# Patient Record
Sex: Female | Born: 1955 | Hispanic: No | Marital: Single | Smoking: Current every day smoker
Health system: Southern US, Community
[De-identification: ages and names within clinical notes are randomized; demographics above are authoritative.]

## PROBLEM LIST (undated history)

## (undated) DIAGNOSIS — J449 Chronic obstructive pulmonary disease, unspecified: Secondary | ICD-10-CM

## (undated) DIAGNOSIS — N281 Cyst of kidney, acquired: Secondary | ICD-10-CM

## (undated) DIAGNOSIS — F419 Anxiety disorder, unspecified: Secondary | ICD-10-CM

## (undated) DIAGNOSIS — S2241XA Multiple fractures of ribs, right side, initial encounter for closed fracture: Secondary | ICD-10-CM

## (undated) DIAGNOSIS — E162 Hypoglycemia, unspecified: Secondary | ICD-10-CM

## (undated) DIAGNOSIS — M1990 Unspecified osteoarthritis, unspecified site: Secondary | ICD-10-CM

## (undated) DIAGNOSIS — G4700 Insomnia, unspecified: Secondary | ICD-10-CM

## (undated) HISTORY — PX: ABDOMINAL HYSTERECTOMY: SHX81

## (undated) HISTORY — PX: KIDNEY SURGERY: SHX687

---

## 1980-04-03 HISTORY — PX: RIB FRACTURE SURGERY: SHX2358

## 1980-08-01 DIAGNOSIS — S2241XA Multiple fractures of ribs, right side, initial encounter for closed fracture: Secondary | ICD-10-CM

## 1980-08-01 HISTORY — DX: Multiple fractures of ribs, right side, initial encounter for closed fracture: S22.41XA

## 1998-11-26 ENCOUNTER — Encounter: Admission: RE | Admit: 1998-11-26 | Discharge: 1998-11-26 | Payer: Self-pay | Admitting: Internal Medicine

## 1999-06-02 ENCOUNTER — Encounter: Admission: RE | Admit: 1999-06-02 | Discharge: 1999-06-02 | Payer: Self-pay | Admitting: Hematology and Oncology

## 2000-01-04 ENCOUNTER — Emergency Department (HOSPITAL_COMMUNITY): Admission: EM | Admit: 2000-01-04 | Discharge: 2000-01-05 | Payer: Self-pay | Admitting: Emergency Medicine

## 2000-01-24 ENCOUNTER — Encounter: Payer: Self-pay | Admitting: Emergency Medicine

## 2000-01-24 ENCOUNTER — Emergency Department (HOSPITAL_COMMUNITY): Admission: EM | Admit: 2000-01-24 | Discharge: 2000-01-24 | Payer: Self-pay

## 2002-01-24 ENCOUNTER — Other Ambulatory Visit: Admission: RE | Admit: 2002-01-24 | Discharge: 2002-01-24 | Payer: Self-pay | Admitting: Internal Medicine

## 2002-12-29 ENCOUNTER — Other Ambulatory Visit: Admission: RE | Admit: 2002-12-29 | Discharge: 2002-12-29 | Payer: Self-pay | Admitting: *Deleted

## 2003-01-09 ENCOUNTER — Encounter (INDEPENDENT_AMBULATORY_CARE_PROVIDER_SITE_OTHER): Payer: Self-pay

## 2003-01-09 ENCOUNTER — Ambulatory Visit (HOSPITAL_COMMUNITY): Admission: RE | Admit: 2003-01-09 | Discharge: 2003-01-09 | Payer: Self-pay | Admitting: *Deleted

## 2003-08-18 ENCOUNTER — Observation Stay (HOSPITAL_COMMUNITY): Admission: RE | Admit: 2003-08-18 | Discharge: 2003-08-19 | Payer: Self-pay | Admitting: *Deleted

## 2003-08-18 ENCOUNTER — Encounter (INDEPENDENT_AMBULATORY_CARE_PROVIDER_SITE_OTHER): Payer: Self-pay | Admitting: Specialist

## 2006-08-19 ENCOUNTER — Inpatient Hospital Stay (HOSPITAL_COMMUNITY): Admission: EM | Admit: 2006-08-19 | Discharge: 2006-08-23 | Payer: Self-pay | Admitting: Emergency Medicine

## 2006-08-23 ENCOUNTER — Ambulatory Visit: Payer: Self-pay | Admitting: Hematology & Oncology

## 2006-08-29 ENCOUNTER — Ambulatory Visit: Payer: Self-pay | Admitting: *Deleted

## 2006-10-03 ENCOUNTER — Ambulatory Visit: Payer: Self-pay | Admitting: Internal Medicine

## 2007-01-24 ENCOUNTER — Observation Stay (HOSPITAL_COMMUNITY): Admission: EM | Admit: 2007-01-24 | Discharge: 2007-01-25 | Payer: Self-pay | Admitting: Emergency Medicine

## 2007-02-13 ENCOUNTER — Emergency Department (HOSPITAL_COMMUNITY): Admission: EM | Admit: 2007-02-13 | Discharge: 2007-02-13 | Payer: Self-pay | Admitting: *Deleted

## 2007-03-09 ENCOUNTER — Ambulatory Visit (HOSPITAL_COMMUNITY): Admission: RE | Admit: 2007-03-09 | Discharge: 2007-03-09 | Payer: Self-pay | Admitting: Orthopedic Surgery

## 2008-04-15 ENCOUNTER — Ambulatory Visit: Payer: Self-pay | Admitting: Internal Medicine

## 2008-04-16 ENCOUNTER — Ambulatory Visit: Payer: Self-pay | Admitting: Internal Medicine

## 2008-07-03 ENCOUNTER — Emergency Department (HOSPITAL_COMMUNITY): Admission: EM | Admit: 2008-07-03 | Discharge: 2008-07-03 | Payer: Self-pay | Admitting: Emergency Medicine

## 2008-07-06 ENCOUNTER — Ambulatory Visit: Payer: Self-pay | Admitting: Internal Medicine

## 2008-07-06 ENCOUNTER — Encounter: Admission: RE | Admit: 2008-07-06 | Discharge: 2008-07-06 | Payer: Self-pay | Admitting: Internal Medicine

## 2008-11-02 ENCOUNTER — Ambulatory Visit: Payer: Self-pay | Admitting: Internal Medicine

## 2009-03-08 ENCOUNTER — Emergency Department (HOSPITAL_COMMUNITY): Admission: EM | Admit: 2009-03-08 | Discharge: 2009-03-08 | Payer: Self-pay | Admitting: Emergency Medicine
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
## 2010-08-16 NOTE — Discharge Summary (Signed)
NAMEMARCEL, [name]             ACCOUNT NO.:  [id number]   MEDICAL RECORD NO.:  [id number]          PATIENT TYPE:  INP   LOCATION:  1527                         FACILITY:  [facility]   PHYSICIAN:  [name], M.D.   DATE OF BIRTH:  Oct 08, 1955   DATE OF ADMISSION:  08/19/2006  DATE OF DISCHARGE:  08/23/2006                               DISCHARGE SUMMARY   PRIMARY CARE PHYSICIAN:  [name], M.D.   HEMATOLOGIST:  [name], but spoke with Dr. Donnie Campbell regarding the  patient's neutropenia.   DISCHARGE DIAGNOSES:  1. Cellulitis.  2. Absolute neutropenia  3. Ongoing tobacco abuse.  4. Remote history of polysubstance abuse.  5. Bipolar disorder.  6. Anxiety disorder.   DISCHARGE MEDICATIONS:  1. Celexa 10 mg daily.  2. Avelox 400 mg daily x1 week.  3. Trazodone 100 mg at bedtime p.r.n.   CONSULTATIONS:  None.  However, spoke with Dr. Donnie Campbell by telephone  regarding the patient's neutropenia.   PROCEDURES AND DIAGNOSTIC STUDIES:  1. Chest x-ray on Aug 19, 2006 showed deformity and evidence of prior      surgery in the right chest wall.  The patient had a flame-shaped      opacity in the right suprahilar region likely related to scarring,      but neoplasm could not be completely excluded.  2. Right ankle film on Aug 19, 2006 showed no acute bony      abnormalities.   DISCHARGE LABORATORY VALUES:  Sodium was 138, potassium 3.9, chloride  107, bicarbonate 26, BUN 9, creatinine 0.93, glucose 83.  White blood  cell count was 3.7, hemoglobin 12.9, hematocrit 37.7, platelet count  283, with an absolute neutrophil count of 0.3.   BRIEF ADMISSION HISTORY OF PRESENT ILLNESS:  The patient is a 55-year-  old female who presented with a chief complaint of right foot swelling  of one day's duration.  The patient claims to have had a recent lice  infestation approximately one month prior to this and used Lice B Gone  for eradication.  She has had persistent pruritus all over her body  and  excoriations from self-inflicted scratching which have become  superinfected.  She was admitted for IV antibiotics for cellulitis.   HOSPITAL COURSE:  1. Cellulitis:  The patient's cellulitis was localized to her right      foot.  She had various areas of excoriations throughout her body      from self-induced scratching.  The patient seemed to have a      persistent preoccupation with thinking that she had ongoing lice      infestation.  A thorough examination of her hair follicles failed      to reveal any evidence of lice or other parasites.  She was      initially empirically treated with Unasyn and clindamycin.  Her      cellulitis rapidly resolved on this therapy.  She has been      transitioned over to p.o. Avelox will complete an additional week      of therapy, given her absolute neutropenia.  2. Absolute neutropenia:  The patient's initial white blood cell count      with differential showed a leukocytosis and absolute neutropenia.      The patient states that she has been told that she has had a low      blood count all of her life, but cannot specify any details beyond      this.  The patient remained neutropenic throughout the course of      her hospitalization.  Her thyroid function was checked and was      found to be normal.  Hepatitis B and C serologies were negative.      Direct and indirect antiglobulin studies were negative.  There was      no evidence of B12 or folate deficiency on laboratory testing.  ANA      studies are pending at the time of this dictation, but there is no      evidence of connective tissue disease or synovitis clinically.  The      patient's neutropenia was prior to any initiation of antibiotic      therapy.  She was not on any medications to suggest a possible      etiology of adverse drug reaction.  Given these findings, the case      was discussed with Dr. Donnie Campbell of hematology.  He does recommend      further outpatient diagnostic workup  with bone marrow biopsy.  Dr.      Renelda Campbell office will call the patient for a follow-up appointment      for further evaluation.  The patient was given instructions on      neutropenic precautions prior to discharge.  3. Tobacco abuse:  The patient was counseled on the importance of      tobacco cessation.  4. History of polysubstance abuse:  No evidence of any recent use.      The patient did not have any problems with withdrawal or other      symptoms suggestive of ongoing abuse.  5. Bipolar disorder/anxiety disorder:  The patient did have an      appreciable anxiety with regard to a preoccupation with her hygiene      and fear of ongoing lice infestation.  She claimed to have been      able to see the lice running through her hair and to feel them.      Because this is not compatible with lice infestation, a psychiatric      consultation was requested and kindly provided by Dr. Jeanie Campbell,      who recommended the addition of Celexa and Seroquel to her      medication regimen.  She had a possible adverse reaction to the      Seroquel so this was discontinued.  He suggests using trazodone      p.r.n. for sleep.  These changes were instituted.  The patient had      significant relief of her pruritus and self-induced excoriations      while in the hospital.  She will follow up with mental health post      discharge.   DISPOSITION:  The patient is medically stable for discharge.  We are  awaiting [name] final recommendations with regard to discharge  home versus an elective voluntary psychiatric hospitalization.  She  should follow up with her primary care physician early next week and  with Dr. Donnie Campbell or one of the other hematologists when called with an  appointment time.  [name], M.D.  Electronically Signed     CR/MEDQ  D:  08/23/2006  T:  08/23/2006  Job:  [id number]   cc:   [name], M.D.  Fax: [phone]  [name], M.D.  Fax: [phone]
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
## 2010-08-16 NOTE — Consult Note (Signed)
NAMELYSSA, HACKLEY             ACCOUNT NO.:  [id number]   MEDICAL RECORD NO.:  [id number]          PATIENT TYPE:  INP   LOCATION:  1527                         FACILITY:  [facility]   PHYSICIAN:  Antonietta Breach, M.D.  DATE OF BIRTH:  08-May-1955   DATE OF CONSULTATION:  08/21/2006  DATE OF DISCHARGE:                                 CONSULTATION   REQUESTING PHYSICIAN:  Hillery Aldo, M.D.   REASON FOR CONSULTATION:  Psychosis and anxiety.   HISTORY OF PRESENT ILLNESS:  Ms. Belanger is a 55 year old female  admitted to the [facility] on Aug 19, 2006.   The patient was suffering from a right foot swelling.  She had been  experiencing the sensation that she had lice in her head after she had  already been rid of lice for a month.  This was a very frustrating  experience for her and it stimulated her to scratch her scalp as well as  her leg and back.  She does have intact interests and positive goals.  She has no thoughts of harming herself or others.  She has no delusions.   PAST PSYCHIATRIC HISTORY:  The patient does have a history of abusing  alcohol.  She does not use any illegal drugs.  She has never had any  hallucinations before.   FAMILY PSYCHIATRIC HISTORY:  None known.   SOCIAL HISTORY:  Religion is [employer].  The patient is single.  She  resides alone.  She smokes a pack of cigarettes a day.   The patient does have a history of IV drug abuse including heroin.  She  has used cocaine and marijuana.  She has been free of use for 4 years.  She does have a history of chemical dependency rehabilitation.   PAST MEDICAL HISTORY:  1. Right foot cellulitis.  2. Lice infestation.  3. Leukopenia.   MEDICATIONS:  The MAR is reviewed and the patient is on Trazodone 50 mg  nightly and Valium 5 mg b.i.d.   ALLERGIES:  SULFA.   LABORATORY DATA:  WBC 3.1, hemoglobin 12.3, platelet count 249, BUN 8,  creatinine 0.87, calcium 10.7.   REVIEW OF SYSTEMS:   Noncontributory.   PHYSICAL EXAMINATION:  VITAL SIGNS:  Temperature 98.2, pulse 82,  respiration 20, blood pressure 110/74, O2 saturation on room air 100%.   MENTAL STATUS EXAM:  Ms. Broadfoot is an alert female sitting up in her  hospital bed with good eye contact.  She is oriented to all spheres.  Her memory is intact to immediate recent and remote.  Her speech is  within normal limits.  Thought process logical, coherent, goal-directed.  No looseness of associations.  Thought content with no thoughts of  harming herself, no thoughts of harming others, no delusions.  Hallucinations as described above.  Insight is good.  She realizes that  these sensations are nonrealistic.  Her judgment is within normal  limits.  She is socially appropriate.   ASSESSMENT:  AXIS I:  1.  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  1. (293.84) Anxiety disorder, not otherwise specified.  AXIS II:  Deferred.  AXIS III:  See general medical problems.  AXIS IV:  Primary support group, general medical.  AXIS V:  55.   Ms. Sliva is not at risk to harm herself or others.  She agrees to  use psychiatric emergency services as needed.   The indications, alternatives and adverse effects of the following were  discussed with the patient:  Celexa for antianxiety, antidepression,  Seroquel for antipsychosis including the risk of nonreversible movement  hyperglycemia and trazodone for insomnia.   The patient understands and would like to proceed as follows.   RECOMMENDATIONS:  1. Celexa 10 mg q.a.m.  2. Discontinue Trazodone and continue the Trazodone p.r.n.  3. Start Seroquel 50 mg nightly.      Antonietta Breach, M.D.  Electronically Signed     JW/MEDQ  D:  09/09/2006  T:  09/10/2006  Job:  [id number]
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
## 2010-08-19 NOTE — Op Note (Signed)
NAME:  [name], [name]                       ACCOUNT NO.:  [id number]   MEDICAL RECORD NO.:  [id number]                   PATIENT TYPE:  OBV   LOCATION:  9399                                 [facility]ACILITY[facility]   PHYSICIAN:  [place] B. Earlene Plater, M.D.               DATE OF BIRTH:  [birth year]   DATE OF PROCEDURE:  08/18/2003  DATE OF DISCHARGE:                                 OPERATIVE REPORT   PREOPERATIVE DIAGNOSES:  1. Menorrhagia.  2. Dysmenorrhea.   POSTOPERATIVE DIAGNOSES:  1. Menorrhagia.  2. Dysmenorrhea.   PROCEDURE:  Laparoscopically-assisted vaginal hysterectomy.   SURGEON:  [place] Holstein. Earlene Plater, M.D.   ASSISTANT:  Lenoard Aden, M.D.   ANESTHESIA:  General.   FINDINGS:  Normal-sized uterus with a pedunculated fundal fibroid.  Normal-  appearing tubes and ovaries.  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  Liver scarring consistent with previous alcohol abuse.   ESTIMATED BLOOD LOSS:  150.   SPECIMENS:  Uterus, cervix.   DRAINS:  Foley.   COMPLICATIONS:  None.   INDICATIONS:  Patient with above complaints, status post previous  hysteroscopic removal of endometrial polyp, with persistent complaints of  menorrhagia and dysmenorrhea.  She was offered and recommended to have  either an endometrial ablation or a levonorgestrel IUD.  The patient  declined both, stating she preferred to proceed with definitive surgical  therapy.   PROCEDURE:  Patient taken to the operating room and general anesthesia  obtained.  She was prepped and draped in the standard fashion and a Foley  catheter inserted into the bladder.  A Hulka tenaculum attached to the  anterior lip of the cervix.   Attention turned to the abdomen.  A vertical incision made in the umbilicus,  carried sharply to the fascia.  The fascia was divided sharply and elevated  with the Kocher clamps.  Posterior sheath and  peritoneum were elevated with  Allis clamp and entered sharply with a knife, a pursestring suture of 0  Vicryl placed around the fascial defect, Hasson cannula inserted and  secured.  Pneumoperitoneum obtained with CO2 gas.   Trendelenburg position obtained and 5 mm port was placed in each lower  quadrant under direct laparoscopic visualization.  The bowel was mobilized  superiorly with a blunt probe and pelvis and abdomen inspected with the  above findings noted.   The course of the left ureter was identified and found to be well away from  the area of interest.  There was an implant of endometriosis over the left  ureter midway through its course in the pelvis.  This was left untreated  given its proximity to the ureter.   The left round ligament was placed on traction and cauterized with bipolar  and divided.  The left tube and uterine-ovarian ligament were similarly  cauterized and divided.  The upper portion of the cardinal ligaments was  then cauterized with bipolar and divided sharply.  The bladder flap was  created sharply and the area of previously-noted endometriosis was excised  along with the bladder flap dissection.   The procedure was repeated on the right side in a similar fashion after  identification of the course of the ureter.  The remainder of the bladder  flap was taken down sharply.  Pneumoperitoneum was released and attention  returned to the vagina.   The patient is nulliparous, and there was not adequate room to insert a  weighted speculum without making an episiotomy.  Therefore, a second degree  episiotomy was made after local infiltration with Nesacaine.  This was done  with the Mayo scissors in a vertical fashion.  This allowed adequate room to  insert the speculum and enter the posterior cul-de-sac sharply with Mayo  scissors.  The long weighted speculum was inserted into the posterior cul-de-  sac and the uterosacral ligaments clamped on each side with  a Heaney clamp,  divided, and suture ligated.  The remainder of the vagina was circumscribed  with the Bovie and the anterior cul-de-sac entered sharply.  A Deaver  retractor placed in the anterior cul-de-sac.   The remainder of the cardinal ligaments were then clamped on each side with  the LigaSure device, cauterized, and divided sharply.  The uterus and cervix  were delivered as an intact specimen without difficulty.   The vaginal cuff was inspected and was otherwise hemostatic.   A culdoplasty suture was placed by entering the vagina posteriorly on the  left, taken by the left uterosacral ligament, reefed along the posterior  peritoneum, taken by the right uterosacral ligament, and exiting the vagina  posteriorly on the right.  This was tagged for a later time.   The remainder of the vaginal cuff was then closed with interrupted figure-of-  eight sutures of 0 Vicryl.  Hemostasis obtained.  The previously-placed  culdoplasty suture was then snugged down and tied.  This provided good  support for the vaginal cuff.   The episiotomy was then closed with 2-0 Vicryl in the muscle and then 3-0  Vicryl in a subcuticular fashion on the skin.   Attention was then returned to the abdomen.  The gown and gloves were  changed and pneumoperitoneum re-obtained.  The pelvis was inspected,  irrigated, and found to be hemostatic.  The 5 mm ports were then removed and  the sites inspected laparoscopically and found to be hemostatic.   The scope was removed, gas released, and the Hasson cannula removed.  The  abdominal wall was elevated with Army-[employer] retractors and the fascial  incision closed by snugging down the previously-placed pursestring suture.  This obliterated the fascial defect, and no intra-abdominal contents  herniated through prior to closure.  The skin at each site was closed with subcuticular 4-0 Vicryl.  On the left lower quadrant port, this did not  afford good reapproximation of  the incision edges; therefore, a simple  stitch of the same suture was placed.   The patient tolerated the procedure well.  There were no complications.  She  was taken to the recovery room awake and in stable condition.  All counts  were correct per the operating room staff.  Gerri Spore B. Earlene Plater, M.D.    WBD/MEDQ  D:  08/18/2003  T:  08/19/2003  Job:  [id number]
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
## 2010-08-19 NOTE — Op Note (Signed)
   NAME:  JAREN, KEARN                       ACCOUNT NO.:  [id number]   MEDICAL RECORD NO.:  [id number]                   PATIENT TYPE:  AMB   LOCATION:  SDC                                  [facility]   PHYSICIAN:  [place] B. Earlene Plater, M.D.               DATE OF BIRTH:  [birth year]   DATE OF PROCEDURE:  01/09/2003  DATE OF DISCHARGE:                                 OPERATIVE REPORT   PREOPERATIVE DIAGNOSIS:  Abnormal bleeding.   POSTOPERATIVE DIAGNOSIS:  Abnormal bleeding.   PROCEDURES:  1. Hysteroscopy dilatation and curettage.  2. Polyp removal.   SURGEON:  [place] B. Earlene Plater, M.D.   ANESTHESIA:  General.   FINDINGS:  Two endometrial polyps and shaggy endometrium.   SPECIMENS:  Endometrial polyps and endometrial curettings.   INDICATIONS FOR PROCEDURE:  The patient with a history of abnormal bleeding,  thickened endometrium on ultrasound in the office was not tolerating vaginal  probe ultrasound well and I did not think she would tolerate a saline  infusion ultrasound prior to hysteroscopy.   PROCEDURE:  The patient was taken to the operating room and general  anesthesia obtained.  She was placed in the [place] stirrups and prepped and  draped in the standard fashion.  The bladder emptied with a red rubber  catheter.  Examination under anesthesia showed a normal sized, anteverted  uterus.  No adnexal masses palpable.   Speculum inserted and a single-tooth tenaculum attached to the anterior lip  of the cervix.  The cervix dilated to 21 without difficulty.  The diagnostic  hysteroscope was inserted after being flushed with sorbitol.  With good  uterine distention two endometrial polyps were visualized.  These were  removed with the Randall stone forceps and the endometrium gently curetted.  The scope was reinserted and no other abnormalities noted.  Therefore, the  procedure was terminated.   The patient tolerated the procedure well.  There were no complications.  She  was taken to the recovery room awake, alert, and in stable condition.  Fluid  deficit was 20 mL of sorbitol.                                               Gerri Spore B. Earlene Plater, M.D.    WBD/MEDQ  D:  01/09/2003  T:  01/09/2003  Job:  [id number]

## 2010-08-31 ENCOUNTER — Emergency Department (HOSPITAL_COMMUNITY): Payer: Self-pay

## 2010-08-31 ENCOUNTER — Other Ambulatory Visit (HOSPITAL_COMMUNITY): Payer: Self-pay | Admitting: Family Medicine

## 2010-08-31 ENCOUNTER — Emergency Department (HOSPITAL_COMMUNITY)
Admission: EM | Admit: 2010-08-31 | Discharge: 2010-08-31 | Disposition: A | Discharge status: Home / Self Care | Payer: Self-pay | Attending: Emergency Medicine | Admitting: Emergency Medicine

## 2010-08-31 DIAGNOSIS — J4489 Other specified chronic obstructive pulmonary disease: Secondary | ICD-10-CM | POA: Insufficient documentation

## 2010-08-31 DIAGNOSIS — J449 Chronic obstructive pulmonary disease, unspecified: Secondary | ICD-10-CM | POA: Insufficient documentation

## 2010-08-31 DIAGNOSIS — M7981 Nontraumatic hematoma of soft tissue: Secondary | ICD-10-CM | POA: Insufficient documentation

## 2010-08-31 DIAGNOSIS — R0789 Other chest pain: Secondary | ICD-10-CM | POA: Insufficient documentation

## 2010-08-31 LAB — COMPREHENSIVE METABOLIC PANEL
ALT: 18 U/L (ref 0–35)
AST: 20 U/L (ref 0–37)
Albumin: 3.9 g/dL (ref 3.5–5.2)
Alkaline Phosphatase: 84 U/L (ref 39–117)
BUN: 16 mg/dL (ref 6–23)
CO2: 24 mEq/L (ref 19–32)
Calcium: 10.2 mg/dL (ref 8.4–10.5)
Chloride: 104 mEq/L (ref 96–112)
Creatinine, Ser: 0.73 mg/dL (ref 0.4–1.2)
GFR calc Af Amer: >60 mL/min (ref >60)
GFR calc non Af Amer: >60 mL/min (ref >60)
Glucose, Bld: 92 mg/dL (ref 70–99)
Potassium: 4.7 mEq/L (ref 3.5–5.1)
Sodium: 136 mEq/L (ref 135–145)
Total Bilirubin: 0.3 mg/dL (ref 0.3–1.2)
Total Protein: 6.3 g/dL (ref 6.0–8.3)

## 2010-08-31 LAB — DIFFERENTIAL
Basophils Absolute: 0.1 10*3/uL (ref 0.0–0.1)
Basophils Relative: 1 % (ref 0–1)
Eosinophils Absolute: 0.1 10*3/uL (ref 0.0–0.7)
Eosinophils Relative: 1 % (ref 0–5)
Lymphocytes Relative: 17 % (ref 12–46)
Lymphs Abs: 1.7 10*3/uL (ref 0.7–4.0)
Monocytes Absolute: 0.9 10*3/uL (ref 0.1–1.0)
Monocytes Relative: 8 % (ref 3–12)
Neutro Abs: 7.8 10*3/uL — ABNORMAL HIGH (ref 1.7–7.7)
Neutrophils Relative %: 74 % (ref 43–77)

## 2010-08-31 LAB — PROTIME-INR
INR: 1.03 (ref 0.00–1.49)
Prothrombin Time: 13.7 seconds (ref 11.6–15.2)

## 2010-08-31 LAB — CBC
HCT: 33.5 % — ABNORMAL LOW (ref 36.0–46.0)
Hemoglobin: 11.7 g/dL — ABNORMAL LOW (ref 12.0–15.0)
MCH: 31.9 pg (ref 26.0–34.0)
MCHC: 34.9 g/dL (ref 30.0–36.0)
MCV: 91.3 fL (ref 78.0–100.0)
Platelets: 239 10*3/uL (ref 150–400)
RBC: 3.67 MIL/uL — ABNORMAL LOW (ref 3.87–5.11)
RDW: 12.7 % (ref 11.5–15.5)
WBC: 10.5 10*3/uL (ref 4.0–10.5)

## 2010-08-31 LAB — LIPASE, BLOOD: Lipase: 33 U/L (ref 11–59)

## 2010-08-31 LAB — APTT: aPTT: 27 seconds (ref 24–37)

## 2010-08-31 MED ORDER — IOHEXOL 300 MG/ML  SOLN
75.0000 mL | Freq: Once | INTRAMUSCULAR | Status: AC | PRN
  Administered 2010-08-31: 75 mL via INTRAVENOUS

## 2011-01-11 LAB — URINALYSIS, ROUTINE W REFLEX MICROSCOPIC
Bilirubin Urine: NEGATIVE
Glucose, UA: NEGATIVE
Hgb urine dipstick: NEGATIVE
Ketones, ur: NEGATIVE
Nitrite: NEGATIVE
Protein, ur: NEGATIVE
Specific Gravity, Urine: >1.046 — ABNORMAL HIGH
Urobilinogen, UA: 0.2
pH: 5.5

## 2011-01-11 LAB — CBC
HCT: 31.2 — ABNORMAL LOW
HCT: 34.3 — ABNORMAL LOW
Hemoglobin: 10.9 — ABNORMAL LOW
Hemoglobin: 11.9 — ABNORMAL LOW
MCHC: 34.8
MCHC: 35.1
MCV: 93.3
MCV: 94.3
Platelets: 226
Platelets: 272
RBC: 3.34 — ABNORMAL LOW
RBC: 3.64 — ABNORMAL LOW
RDW: 13
RDW: 13.5
WBC: 10.5
WBC: 7.8

## 2011-01-11 LAB — BASIC METABOLIC PANEL
BUN: 6
BUN: 8
CO2: 21
CO2: 27
Calcium: 9
Calcium: 9.9
Chloride: 109
Chloride: 110
Creatinine, Ser: 0.76
Creatinine, Ser: 0.82
GFR calc Af Amer: >60
GFR calc Af Amer: >60
GFR calc non Af Amer: >60
GFR calc non Af Amer: >60
Glucose, Bld: 102 — ABNORMAL HIGH
Glucose, Bld: 83
Potassium: 3.5
Potassium: 4
Sodium: 137
Sodium: 142

## 2011-01-11 LAB — I-STAT 8, (EC8 V) (CONVERTED LAB)
Acid-base deficit: 2
BUN: 9
Bicarbonate: 21.1
Chloride: 111
Glucose, Bld: 104 — ABNORMAL HIGH
HCT: 36
Hemoglobin: 12.2
Operator id: 198171
Potassium: 3.7
Sodium: 142
TCO2: 22
pCO2, Ven: 31.4 — ABNORMAL LOW
pH, Ven: 7.434 — ABNORMAL HIGH

## 2011-01-11 LAB — POCT I-STAT CREATININE
Creatinine, Ser: 0.8
Operator id: 198171

## 2011-04-24 ENCOUNTER — Emergency Department (HOSPITAL_BASED_OUTPATIENT_CLINIC_OR_DEPARTMENT_OTHER)
Admission: EM | Admit: 2011-04-24 | Discharge: 2011-04-24 | Disposition: A | Discharge status: Home / Self Care | Payer: Self-pay | Attending: Emergency Medicine | Admitting: Emergency Medicine

## 2011-04-24 ENCOUNTER — Encounter (HOSPITAL_BASED_OUTPATIENT_CLINIC_OR_DEPARTMENT_OTHER): Payer: Self-pay | Admitting: *Deleted

## 2011-04-24 ENCOUNTER — Emergency Department (INDEPENDENT_AMBULATORY_CARE_PROVIDER_SITE_OTHER): Payer: Self-pay

## 2011-04-24 DIAGNOSIS — S42009A Fracture of unspecified part of unspecified clavicle, initial encounter for closed fracture: Secondary | ICD-10-CM | POA: Insufficient documentation

## 2011-04-24 DIAGNOSIS — J449 Chronic obstructive pulmonary disease, unspecified: Secondary | ICD-10-CM | POA: Insufficient documentation

## 2011-04-24 DIAGNOSIS — J4489 Other specified chronic obstructive pulmonary disease: Secondary | ICD-10-CM | POA: Insufficient documentation

## 2011-04-24 DIAGNOSIS — IMO0002 Reserved for concepts with insufficient information to code with codable children: Secondary | ICD-10-CM | POA: Insufficient documentation

## 2011-04-24 DIAGNOSIS — Z79899 Other long term (current) drug therapy: Secondary | ICD-10-CM | POA: Insufficient documentation

## 2011-04-24 DIAGNOSIS — F17200 Nicotine dependence, unspecified, uncomplicated: Secondary | ICD-10-CM | POA: Insufficient documentation

## 2011-04-24 DIAGNOSIS — S42002A Fracture of unspecified part of left clavicle, initial encounter for closed fracture: Secondary | ICD-10-CM

## 2011-04-24 DIAGNOSIS — W19XXXA Unspecified fall, initial encounter: Secondary | ICD-10-CM

## 2011-04-24 DIAGNOSIS — Z8739 Personal history of other diseases of the musculoskeletal system and connective tissue: Secondary | ICD-10-CM | POA: Insufficient documentation

## 2011-04-24 DIAGNOSIS — S42023A Displaced fracture of shaft of unspecified clavicle, initial encounter for closed fracture: Secondary | ICD-10-CM | POA: Insufficient documentation

## 2011-04-24 HISTORY — DX: Cyst of kidney, acquired: N28.1

## 2011-04-24 HISTORY — DX: Multiple fractures of ribs, right side, initial encounter for closed fracture: S22.41XA

## 2011-04-24 HISTORY — DX: Hypoglycemia, unspecified: E16.2

## 2011-04-24 HISTORY — DX: Chronic obstructive pulmonary disease, unspecified: J44.9

## 2011-04-24 HISTORY — DX: Insomnia, unspecified: G47.00

## 2011-04-24 HISTORY — DX: Unspecified osteoarthritis, unspecified site: M19.90

## 2011-04-24 HISTORY — DX: Anxiety disorder, unspecified: F41.9

## 2011-04-24 MED ORDER — OXYCODONE-ACETAMINOPHEN 5-325 MG PO TABS: 2.0000 | ORAL_TABLET | Freq: Four times a day (QID) | ORAL | Status: AC | PRN

## 2011-04-24 MED ORDER — HYDROMORPHONE HCL PF 2 MG/ML IJ SOLN
2.0000 mg | Freq: Once | INTRAMUSCULAR | Status: AC
  Administered 2011-04-24: 2 mg via INTRAMUSCULAR
  Filled 2011-04-24: qty 1
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
## 2015-06-16 ENCOUNTER — Emergency Department (HOSPITAL_COMMUNITY)
Admission: EM | Admit: 2015-06-16 | Discharge: 2015-06-16 | Disposition: A | Discharge status: Home / Self Care | Payer: BLUE CROSS/BLUE SHIELD | Attending: Emergency Medicine | Admitting: Emergency Medicine

## 2015-06-16 ENCOUNTER — Emergency Department (HOSPITAL_COMMUNITY): Payer: BLUE CROSS/BLUE SHIELD

## 2015-06-16 DIAGNOSIS — Z79899 Other long term (current) drug therapy: Secondary | ICD-10-CM | POA: Insufficient documentation

## 2015-06-16 DIAGNOSIS — Y9289 Other specified places as the place of occurrence of the external cause: Secondary | ICD-10-CM | POA: Diagnosis not present

## 2015-06-16 DIAGNOSIS — S62318A Displaced fracture of base of other metacarpal bone, initial encounter for closed fracture: Secondary | ICD-10-CM

## 2015-06-16 DIAGNOSIS — Y998 Other external cause status: Secondary | ICD-10-CM | POA: Insufficient documentation

## 2015-06-16 DIAGNOSIS — Y9389 Activity, other specified: Secondary | ICD-10-CM | POA: Diagnosis not present

## 2015-06-16 DIAGNOSIS — Z8639 Personal history of other endocrine, nutritional and metabolic disease: Secondary | ICD-10-CM | POA: Diagnosis not present

## 2015-06-16 DIAGNOSIS — S62397A Other fracture of fifth metacarpal bone, left hand, initial encounter for closed fracture: Secondary | ICD-10-CM | POA: Insufficient documentation

## 2015-06-16 DIAGNOSIS — F17210 Nicotine dependence, cigarettes, uncomplicated: Secondary | ICD-10-CM | POA: Insufficient documentation

## 2015-06-16 DIAGNOSIS — G4700 Insomnia, unspecified: Secondary | ICD-10-CM | POA: Diagnosis not present

## 2015-06-16 DIAGNOSIS — W2209XA Striking against other stationary object, initial encounter: Secondary | ICD-10-CM | POA: Insufficient documentation

## 2015-06-16 DIAGNOSIS — M1990 Unspecified osteoarthritis, unspecified site: Secondary | ICD-10-CM | POA: Diagnosis not present

## 2015-06-16 DIAGNOSIS — Z7982 Long term (current) use of aspirin: Secondary | ICD-10-CM | POA: Insufficient documentation

## 2015-06-16 DIAGNOSIS — J449 Chronic obstructive pulmonary disease, unspecified: Secondary | ICD-10-CM | POA: Insufficient documentation

## 2015-06-16 DIAGNOSIS — Z7951 Long term (current) use of inhaled steroids: Secondary | ICD-10-CM | POA: Diagnosis not present

## 2015-06-16 DIAGNOSIS — S6991XA Unspecified injury of right wrist, hand and finger(s), initial encounter: Secondary | ICD-10-CM | POA: Insufficient documentation

## 2015-06-16 DIAGNOSIS — S6992XA Unspecified injury of left wrist, hand and finger(s), initial encounter: Secondary | ICD-10-CM | POA: Diagnosis present

## 2015-06-16 DIAGNOSIS — F419 Anxiety disorder, unspecified: Secondary | ICD-10-CM | POA: Insufficient documentation

## 2015-06-16 DIAGNOSIS — Q6100 Congenital renal cyst, unspecified: Secondary | ICD-10-CM | POA: Diagnosis not present

## 2015-06-16 MED ORDER — OXYCODONE-ACETAMINOPHEN 5-325 MG PO TABS
1.0000 | ORAL_TABLET | Freq: Once | ORAL | Status: AC
  Administered 2015-06-16: 1 via ORAL
  Filled 2015-06-16: qty 1

## 2015-06-16 MED ORDER — OXYCODONE-ACETAMINOPHEN 5-325 MG PO TABS: 2.0000 | ORAL_TABLET | ORAL | Status: DC | PRN

## 2015-06-16 NOTE — ED Notes (Signed)
Pt transported to xray 
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
## 2015-06-16 NOTE — ED Notes (Signed)
Ortho tech paged  
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
## 2015-06-16 NOTE — ED Notes (Signed)
Pt returned from xray

## 2015-06-16 NOTE — Discharge Instructions (Signed)
Metacarpal Fracture °A metacarpal fracture is a break (fracture) of a bone in the hand. Metacarpals are the bones that extend from your knuckles to your wrist. In each hand, you have five metacarpal bones that connect your fingers and your thumb to your wrist. °Some hand fractures have bone pieces that are close together and stable (simple). These fractures may be treated with only a splint or cast. Hand fractures that have many pieces of broken bone (comminuted), unstable bone pieces (displaced), or a bone that breaks through the skin (compound) usually require surgery. °CAUSES °This injury may be caused by: °· A fall. °· A hard, direct hit to your hand. °· An injury that squeezes your knuckle, stretches your finger out of place, or crushes your hand. °RISK FACTORS °This injury is more likely to occur if: °· You play contact sports. °· You have certain bone diseases. °SYMPTOMS  °Symptoms of this type of fracture develop soon after the injury. Symptoms may include: °· Swelling. °· Pain. °· Stiffness. °· Increased pain with movement. °· Bruising. °· Inability to move a finger. °· A shortened finger. °· A finger knuckle that looks sunken in. °· Unusual appearance of the hand or finger (deformity). °DIAGNOSIS  °This injury may be diagnosed based on your signs and symptoms, especially if you had a recent hand injury. Your health care provider will perform a physical exam. He or she may also order X-rays to confirm the diagnosis.  °TREATMENT  °Treatment for this injury depends on the type of fracture you have and how severe it is. Possible treatments include: °· Non-reduction. This can be done if the bone does not need to be moved back into place. The fracture can be casted or splinted as it is.   °· Closed reduction. If your bone is stable and can be moved back into place, you may only need to wear a cast or splint or have buddy taping. °· Closed reduction with internal fixation (CRIF). This is the most common  treatment. You may have this procedure if your bone can be moved back into place but needs more support. Wires, pins, or screws may be inserted through your skin to stabilize the fracture. °· Open reduction with internal fixation (ORIF). This may be needed if your fracture is severe and unstable. It involves surgery to move your bone back into the right position. Screws, wires, or plates are used to stabilize the fracture. °After all procedures, you may need to wear a cast or a splint for several weeks. You will also need to have follow-up X-rays to make sure that the bone is healing well and staying in position. After you no longer need your cast or splint, you may need physical therapy. This will help you to regain full movement and strength in your hand.  °HOME CARE INSTRUCTIONS  °If You Have a Cast: °· Do not stick anything inside the cast to scratch your skin. Doing that increases your risk of infection. °· Check the skin around the cast every day. Report any concerns to your health care provider. You may put lotion on dry skin around the edges of the cast. Do not apply lotion to the skin underneath the cast. °If You Have a Splint: °· Wear it as directed by your health care provider. Remove it only as directed by your health care provider. °· Loosen the splint if your fingers become numb and tingle, or if they turn cold and blue. °Bathing °· Cover the cast or splint with a   watertight plastic bag to protect it from water while you take a bath or a shower. Do not let the cast or splint get wet. Managing Pain, Stiffness, and Swelling  If directed, apply ice to the injured area (if you have a splint, not a cast):  Put ice in a plastic bag.  Place a towel between your skin and the bag.  Leave the ice on for 20 minutes, 2-3 times a day.  Move your fingers often to avoid stiffness and to lessen swelling.  Raise the injured area above the level of your heart while you are sitting or lying  down. Driving  Do not drive or operate heavy machinery while taking pain medicine.  Do not drive while wearing a cast or splint on a hand that you use for driving. Activity  Return to your normal activities as directed by your health care provider. Ask your health care provider what activities are safe for you. General Instructions  Do not put pressure on any part of the cast or splint until it is fully hardened. This may take several hours.  Keep the cast or splint clean and dry.  Do not use any tobacco products, including cigarettes, chewing tobacco, or electronic cigarettes. Tobacco can delay bone healing. If you need help quitting, ask your health care provider.  Take medicines only as directed by your health care provider.  Keep all follow-up visits as directed by your health care provider. This is important. SEEK MEDICAL CARE IF:   Your pain is getting worse.  You have redness, swelling, or pain in the injured area.   You have fluid, blood, or pus coming from under your cast or splint.   You notice a bad smell coming from under your cast or splint.   You have a fever.  SEEK IMMEDIATE MEDICAL CARE IF:   You develop a rash.   You have trouble breathing.   Your skin or nails on your injured hand turn blue or gray even after you loosen your splint.  Your injured hand feels cold or becomes numb even after you loosen your splint.   You develop severe pain under the cast or in your hand.   This information is not intended to replace advice given to you by your health care provider. Make sure you discuss any questions you have with your health care provider.   Follow-up with hand surgeon as soon as possible for reevaluation. Take pain medication as needed. Apply ice to affected area. Return to the emergency department if you experience any worsening of her symptoms, numbness or tingling or extremity, severe swelling or redness from your wound, fevers, chills.

## 2016-07-20 ENCOUNTER — Telehealth: Payer: Self-pay | Admitting: *Deleted

## 2016-07-21 ENCOUNTER — Ambulatory Visit: Payer: BLUE CROSS/BLUE SHIELD | Admitting: Family Medicine

## 2016-07-21 NOTE — Telephone Encounter (Signed)
PreVisit Call attempted. Left VM. 

## 2016-07-26 ENCOUNTER — Ambulatory Visit (INDEPENDENT_AMBULATORY_CARE_PROVIDER_SITE_OTHER): Payer: BLUE CROSS/BLUE SHIELD

## 2016-07-26 ENCOUNTER — Ambulatory Visit (INDEPENDENT_AMBULATORY_CARE_PROVIDER_SITE_OTHER): Payer: BLUE CROSS/BLUE SHIELD | Admitting: Family Medicine

## 2016-07-26 ENCOUNTER — Encounter: Payer: Self-pay | Admitting: Family Medicine

## 2016-07-26 VITALS — BP 126/74 | HR 74 | Temp 97.9°F | Ht 65.5 in | Wt 133.4 lb

## 2016-07-26 DIAGNOSIS — J209 Acute bronchitis, unspecified: Secondary | ICD-10-CM | POA: Diagnosis not present

## 2016-07-26 DIAGNOSIS — J440 Chronic obstructive pulmonary disease with acute lower respiratory infection: Secondary | ICD-10-CM | POA: Diagnosis not present

## 2016-07-26 DIAGNOSIS — F17200 Nicotine dependence, unspecified, uncomplicated: Secondary | ICD-10-CM | POA: Diagnosis not present

## 2016-07-26 DIAGNOSIS — J45909 Unspecified asthma, uncomplicated: Secondary | ICD-10-CM

## 2016-07-26 DIAGNOSIS — R079 Chest pain, unspecified: Secondary | ICD-10-CM

## 2016-07-26 LAB — CBC WITH DIFFERENTIAL/PLATELET
Basophils Absolute: 0.1 10*3/uL (ref 0.0–0.1)
Basophils Relative: 1.2 % (ref 0.0–3.0)
Eosinophils Absolute: 0.1 10*3/uL (ref 0.0–0.7)
Eosinophils Relative: 2.1 % (ref 0.0–5.0)
HCT: 39.2 % (ref 36.0–46.0)
Hemoglobin: 13.3 g/dL (ref 12.0–15.0)
Lymphocytes Relative: 28.3 % (ref 12.0–46.0)
Lymphs Abs: 1.7 10*3/uL (ref 0.7–4.0)
MCHC: 34 g/dL (ref 30.0–36.0)
MCV: 93.3 fl (ref 78.0–100.0)
Monocytes Absolute: 0.7 10*3/uL (ref 0.1–1.0)
Monocytes Relative: 11.3 % (ref 3.0–12.0)
Neutro Abs: 3.5 10*3/uL (ref 1.4–7.7)
Neutrophils Relative %: 57.1 % (ref 43.0–77.0)
Platelets: 262 10*3/uL (ref 150.0–400.0)
RBC: 4.2 Mil/uL (ref 3.87–5.11)
RDW: 13.6 % (ref 11.5–15.5)
WBC: 6.2 10*3/uL (ref 4.0–10.5)

## 2016-07-26 LAB — LIPID PANEL
Cholesterol: 237 mg/dL — ABNORMAL HIGH (ref 0–200)
HDL: 45.7 mg/dL (ref >39.00)
LDL Cholesterol: 167 mg/dL — ABNORMAL HIGH (ref 0–99)
NonHDL: 190.99
Total CHOL/HDL Ratio: 5
Triglycerides: 118 mg/dL (ref 0.0–149.0)
VLDL: 23.6 mg/dL (ref 0.0–40.0)

## 2016-07-26 LAB — COMPREHENSIVE METABOLIC PANEL
ALT: 15 U/L (ref 0–35)
AST: 17 U/L (ref 0–37)
Albumin: 4.2 g/dL (ref 3.5–5.2)
Alkaline Phosphatase: 91 U/L (ref 39–117)
BUN: 13 mg/dL (ref 6–23)
CO2: 27 mEq/L (ref 19–32)
Calcium: 10 mg/dL (ref 8.4–10.5)
Chloride: 109 mEq/L (ref 96–112)
Creatinine, Ser: 0.79 mg/dL (ref 0.40–1.20)
GFR: 78.85 mL/min (ref >60.00)
Glucose, Bld: 94 mg/dL (ref 70–99)
Potassium: 4.2 mEq/L (ref 3.5–5.1)
Sodium: 139 mEq/L (ref 135–145)
Total Bilirubin: 0.2 mg/dL (ref 0.2–1.2)
Total Protein: 6.7 g/dL (ref 6.0–8.3)

## 2016-07-26 LAB — T4, FREE: Free T4: 0.82 ng/dL (ref 0.60–1.60)

## 2016-07-26 LAB — TSH: TSH: 4.28 u[IU]/mL (ref 0.35–4.50)

## 2016-07-26 MED ORDER — ALBUTEROL SULFATE (2.5 MG/3ML) 0.083% IN NEBU: 2.5000 mg | INHALATION_SOLUTION | Freq: Four times a day (QID) | RESPIRATORY_TRACT | 1 refills | Status: AC | PRN

## 2016-07-26 MED ORDER — IPRATROPIUM-ALBUTEROL 20-100 MCG/ACT IN AERS: 1.0000 | INHALATION_SPRAY | Freq: Four times a day (QID) | RESPIRATORY_TRACT | 5 refills | Status: AC | PRN
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
## 2016-07-26 NOTE — Progress Notes (Signed)
Pre visit review using our clinic review tool, if applicable. No additional management support is needed unless otherwise documented below in the visit note. 

## 2016-07-29 DIAGNOSIS — M1990 Unspecified osteoarthritis, unspecified site: Secondary | ICD-10-CM | POA: Insufficient documentation

## 2016-07-29 DIAGNOSIS — Z90710 Acquired absence of both cervix and uterus: Secondary | ICD-10-CM | POA: Insufficient documentation

## 2016-07-29 DIAGNOSIS — J439 Emphysema, unspecified: Secondary | ICD-10-CM | POA: Insufficient documentation

## 2016-07-29 DIAGNOSIS — F419 Anxiety disorder, unspecified: Secondary | ICD-10-CM | POA: Insufficient documentation

## 2016-07-29 DIAGNOSIS — J45909 Unspecified asthma, uncomplicated: Secondary | ICD-10-CM | POA: Insufficient documentation

## 2016-07-29 DIAGNOSIS — F17200 Nicotine dependence, unspecified, uncomplicated: Secondary | ICD-10-CM | POA: Insufficient documentation

## 2016-08-02 NOTE — Addendum Note (Signed)
Addended by: Durwin Glaze on: 08/02/2016 01:53 PM   Modules accepted: Orders

## 2016-08-09 ENCOUNTER — Telehealth: Payer: Self-pay | Admitting: Family Medicine

## 2016-08-09 NOTE — Telephone Encounter (Signed)
Please advise.  Didn't see result note.

## 2016-08-09 NOTE — Telephone Encounter (Signed)
It looks like I signed it - not sure why I did not send results. Apologize for me please. Labs look good. Cholesterol somewhat high. Work on diet - can mail heart healthy diet instructions. Recheck in 6 months. I will recommend a statin or another medication if still high at that time.

## 2016-08-09 NOTE — Telephone Encounter (Signed)
Patient called to get lab results from 4/25 visit. Please call patient back on mobile number. Okay to leave a detailed message on patient's phone.

## 2016-08-10 NOTE — Telephone Encounter (Signed)
Patient notified of results.  Cholesterol handout mailed.

## 2016-08-23 ENCOUNTER — Other Ambulatory Visit: Payer: Self-pay

## 2016-08-23 DIAGNOSIS — J45909 Unspecified asthma, uncomplicated: Secondary | ICD-10-CM

## 2016-08-23 DIAGNOSIS — J440 Chronic obstructive pulmonary disease with acute lower respiratory infection: Secondary | ICD-10-CM

## 2016-08-23 DIAGNOSIS — J209 Acute bronchitis, unspecified: Secondary | ICD-10-CM

## 2016-08-23 DIAGNOSIS — F17200 Nicotine dependence, unspecified, uncomplicated: Secondary | ICD-10-CM

## 2016-08-24 ENCOUNTER — Other Ambulatory Visit: Payer: Self-pay

## 2016-08-24 DIAGNOSIS — J439 Emphysema, unspecified: Secondary | ICD-10-CM

## 2016-08-24 DIAGNOSIS — J45909 Unspecified asthma, uncomplicated: Secondary | ICD-10-CM

## 2016-08-24 DIAGNOSIS — F17200 Nicotine dependence, unspecified, uncomplicated: Secondary | ICD-10-CM

## 2016-08-24 DIAGNOSIS — J440 Chronic obstructive pulmonary disease with acute lower respiratory infection: Principal | ICD-10-CM

## 2016-08-24 DIAGNOSIS — J209 Acute bronchitis, unspecified: Secondary | ICD-10-CM

## 2016-08-29 ENCOUNTER — Ambulatory Visit
Admission: RE | Admit: 2016-08-29 | Discharge: 2016-08-29 | Disposition: A | Discharge status: Home / Self Care | Payer: BLUE CROSS/BLUE SHIELD | Source: Ambulatory Visit | Attending: Family Medicine | Admitting: Family Medicine

## 2016-08-29 DIAGNOSIS — J45909 Unspecified asthma, uncomplicated: Secondary | ICD-10-CM

## 2016-08-29 DIAGNOSIS — F17200 Nicotine dependence, unspecified, uncomplicated: Secondary | ICD-10-CM

## 2016-08-29 DIAGNOSIS — J209 Acute bronchitis, unspecified: Secondary | ICD-10-CM

## 2016-08-29 DIAGNOSIS — J440 Chronic obstructive pulmonary disease with acute lower respiratory infection: Secondary | ICD-10-CM

## 2016-08-30 ENCOUNTER — Telehealth: Payer: Self-pay | Admitting: Family Medicine

## 2016-08-30 DIAGNOSIS — J449 Chronic obstructive pulmonary disease, unspecified: Secondary | ICD-10-CM

## 2016-08-30 NOTE — Telephone Encounter (Signed)
Per Dr. Juleen China, CT showed emphysema.  Otherwise normal.

## 2016-08-30 NOTE — Telephone Encounter (Signed)
Patient called to follow up on CT results. Advised that no interpretation has been entered from dr wallace at this point. She states that if we call tomorrow, to please make it after 1 pm   Verified cell # is best

## 2016-08-30 NOTE — Telephone Encounter (Signed)
Please see annotations below in reference to the time to call. Pt does not want a voicemail left. She is aware of her CT scan results. Looks like her Pulmonology referrals were canceled in the system so she would like to discuss the next step. Does she see Pulmonology or just continue to do what she is doing with her medication treatment. Please advise. Thanks.

## 2016-09-03 ENCOUNTER — Encounter: Payer: Self-pay | Admitting: Family Medicine

## 2016-09-03 DIAGNOSIS — I2510 Atherosclerotic heart disease of native coronary artery without angina pectoris: Secondary | ICD-10-CM | POA: Insufficient documentation

## 2016-09-03 DIAGNOSIS — D3502 Benign neoplasm of left adrenal gland: Secondary | ICD-10-CM | POA: Insufficient documentation

## 2016-09-03 NOTE — Telephone Encounter (Signed)
Please apologize to the patient for me - for all of the runaround and confusion with the CT and referral. Please put in another referral to Pulmonology for evaluation of COPD. She should be plugged into low dose CT screening - they will likely talk to her about it.

## 2016-09-06 NOTE — Telephone Encounter (Signed)
Spoke with patient and notified her that we have placed a referral for pulmonology for COPD. Patient was fine with this.

## 2017-05-02 ENCOUNTER — Ambulatory Visit: Payer: Self-pay

## 2017-05-02 ENCOUNTER — Other Ambulatory Visit: Payer: Self-pay

## 2017-05-02 ENCOUNTER — Telehealth: Payer: Self-pay | Admitting: Family Medicine

## 2017-05-02 ENCOUNTER — Encounter (HOSPITAL_COMMUNITY): Payer: Self-pay

## 2017-05-02 DIAGNOSIS — R1084 Generalized abdominal pain: Secondary | ICD-10-CM | POA: Diagnosis present

## 2017-05-02 DIAGNOSIS — J449 Chronic obstructive pulmonary disease, unspecified: Secondary | ICD-10-CM | POA: Diagnosis not present

## 2017-05-02 DIAGNOSIS — K219 Gastro-esophageal reflux disease without esophagitis: Secondary | ICD-10-CM | POA: Insufficient documentation

## 2017-05-02 DIAGNOSIS — F17210 Nicotine dependence, cigarettes, uncomplicated: Secondary | ICD-10-CM | POA: Insufficient documentation

## 2017-05-02 DIAGNOSIS — Z79899 Other long term (current) drug therapy: Secondary | ICD-10-CM | POA: Insufficient documentation

## 2017-05-02 LAB — CBC
HCT: 41.3 % (ref 36.0–46.0)
Hemoglobin: 14.4 g/dL (ref 12.0–15.0)
MCH: 31.4 pg (ref 26.0–34.0)
MCHC: 34.9 g/dL (ref 30.0–36.0)
MCV: 90.2 fL (ref 78.0–100.0)
Platelets: 329 10*3/uL (ref 150–400)
RBC: 4.58 MIL/uL (ref 3.87–5.11)
RDW: 12.9 % (ref 11.5–15.5)
WBC: 8.5 10*3/uL (ref 4.0–10.5)

## 2017-05-02 LAB — COMPREHENSIVE METABOLIC PANEL
ALT: 16 U/L (ref 14–54)
AST: 17 U/L (ref 15–41)
Albumin: 4.7 g/dL (ref 3.5–5.0)
Alkaline Phosphatase: 107 U/L (ref 38–126)
Anion gap: 7 (ref 5–15)
BUN: 16 mg/dL (ref 6–20)
CO2: 23 mmol/L (ref 22–32)
Calcium: 10.8 mg/dL — ABNORMAL HIGH (ref 8.9–10.3)
Chloride: 106 mmol/L (ref 101–111)
Creatinine, Ser: 0.69 mg/dL (ref 0.44–1.00)
GFR calc Af Amer: >60 mL/min (ref >60)
GFR calc non Af Amer: >60 mL/min (ref >60)
Glucose, Bld: 94 mg/dL (ref 65–99)
Potassium: 4.7 mmol/L (ref 3.5–5.1)
Sodium: 136 mmol/L (ref 135–145)
Total Bilirubin: 0.3 mg/dL (ref 0.3–1.2)
Total Protein: 7.3 g/dL (ref 6.5–8.1)

## 2017-05-02 LAB — LIPASE, BLOOD: Lipase: 39 U/L (ref 11–51)

## 2017-05-02 LAB — I-STAT BETA HCG BLOOD, ED (MC, WL, AP ONLY): I-stat hCG, quantitative: 5.8 m[IU]/mL — ABNORMAL HIGH (ref <5)

## 2017-05-02 NOTE — Telephone Encounter (Signed)
See note

## 2017-05-02 NOTE — Telephone Encounter (Signed)
  Reason for Disposition . Abdomen is more swollen than usual  Answer Assessment - Initial Assessment Questions 1. STOOL PATTERN OR FREQUENCY: "How often do you pass bowel movements (BMs)?"  (Normal range: tid to q 3 days)  "When was the last BM passed?"       3 months  2. STRAINING: "Do you have to strain to have a BM?"      Strain 3. RECTAL PAIN: "Does your rectum hurt when the stool comes out?" If so, ask: "Do you have hemorrhoids? How bad is the pain?"  (Scale 1-10; or mild, moderate, severe)     No 4. STOOL COMPOSITION: "Are the stools hard?"      Hard stools 5. BLOOD ON STOOLS: "Has there been any blood on the toilet tissue or on the surface of the BM?" If so, ask: "When was the last time?"      No 6. CHRONIC CONSTIPATION: "Is this a new problem for you?"  If no, ask: How long have you had this problem?" (days, weeks, months)      No 7. CHANGES IN DIET: "Have there been any recent changes in your diet?"      Having a hard time eating 8. MEDICATIONS: "Have you been taking any new medications?"     No 9. LAXATIVES: "Have you been using any laxatives or enemas?"  If yes, ask "What, how often, and when was the last time?"     No 10. CAUSE: "What do you think is causing the constipation?"        No idea 11. OTHER SYMPTOMS: "Do you have any other symptoms?" (e.g., abdominal pain, fever, vomiting)       Acid reflux, vomiting 12. PREGNANCY: "Is there any chance you are pregnant?" "When was your last menstrual period?"       No  Protocols used: CONSTIPATION-A-AH Pt. Reports she has been having small,hard bowell movements "once a week," Concerned her "acid reflux is causing problems"

## 2017-05-02 NOTE — Telephone Encounter (Signed)
Spoke with patient and she stated that she has had constipation for over three months. She is having abdominal pain and some chest pian with this. She stated that she is having bloating with it as well. She stated that the pain is so bad at times she has to press on the belly to get relief. She has only had one bowel movement a week for the last three months that is " a plop and the size of a thumb nail". Denies any blood in the stool.  She has vomited 3 times in the last week. Patient stated that she has lost some weight due to not being able to eat. She states that her food is getting stuck. She can get pasta down, but when she eats meat it gets stuck and has to "throw it up". I spoke with Dr. Juleen China about this and advised patient to go to the ED per Dr. Juleen China.

## 2017-05-02 NOTE — ED Triage Notes (Signed)
States since November 2018 unable to have a good bowel movement and acid reflux and states she didn't want to come today.

## 2017-05-02 NOTE — Telephone Encounter (Signed)
Pt. Called back - has not been to ED. Asking if she can see a GI doctor. Reminded pt. Of Dr. Mick Sell' recommendation of going to ED. States she go.

## 2017-05-03 ENCOUNTER — Emergency Department (HOSPITAL_COMMUNITY): Payer: BLUE CROSS/BLUE SHIELD

## 2017-05-03 ENCOUNTER — Emergency Department (HOSPITAL_COMMUNITY)
Admission: EM | Admit: 2017-05-03 | Discharge: 2017-05-03 | Disposition: A | Discharge status: Home / Self Care | Payer: BLUE CROSS/BLUE SHIELD | Attending: Emergency Medicine | Admitting: Emergency Medicine

## 2017-05-03 DIAGNOSIS — K219 Gastro-esophageal reflux disease without esophagitis: Secondary | ICD-10-CM

## 2017-05-03 DIAGNOSIS — R1084 Generalized abdominal pain: Secondary | ICD-10-CM

## 2017-05-03 LAB — URINALYSIS, ROUTINE W REFLEX MICROSCOPIC
Bilirubin Urine: NEGATIVE
Glucose, UA: NEGATIVE mg/dL
Hgb urine dipstick: NEGATIVE
Ketones, ur: NEGATIVE mg/dL
Leukocytes, UA: NEGATIVE
Nitrite: NEGATIVE
Protein, ur: NEGATIVE mg/dL
Specific Gravity, Urine: 1.008 (ref 1.005–1.030)
pH: 5 (ref 5.0–8.0)

## 2017-05-03 MED ORDER — POLYETHYLENE GLYCOL 3350 17 GM/SCOOP PO POWD: 17.0000 g | Freq: Two times a day (BID) | ORAL | 0 refills | Status: AC

## 2017-05-03 MED ORDER — FAMOTIDINE 20 MG PO TABS: 20.0000 mg | ORAL_TABLET | Freq: Two times a day (BID) | ORAL | 0 refills | Status: DC

## 2017-05-03 NOTE — Discharge Instructions (Signed)
Take the prescribed medication as directed.  Once her bowel movements regulate, you can cut back on MiraLAX to once daily. I would try to avoid spicy/acidic foods to help reduce acid reflux symptoms. Follow-up with GI-- call for appt or you can have your primary care doctor arrange this. Return to the ED for new or worsening symptoms.

## 2017-05-03 NOTE — ED Provider Notes (Signed)
[facility] DEPT Provider Note   CSN: [id number] Arrival date & time: 05/02/17  1823     History   Chief Complaint Chief Complaint  Patient presents with  . Abdominal Pain    HPI [name] is a 62 y.o. female.  The history is provided by the patient and medical records.    62 year old female with history of anxiety, arthritis, COPD, insomnia, renal cysts, presenting to the ED with abdominal pain.  Reports for the past 3 months she has had ongoing issues regulating her bowel movements.  States prior to the November 2018 she usually went 2 times during the day, every single day.  States now she is lucky if she has 1 bowel movement per month.  States if she is able to pass any stool it is very hard, firm, and small.  She denies any bloating.  She reports some vomiting, but states it is because she feels food gets hung up in her throat, notably meat.  States she is also been suffering with some acid reflux that is brought on by eating spicy or acidic foods.  States she had a bad bout of it last night after eating some ravioli pasta.  She is not having any difficulty drinking liquids.  States she feels like she needs to see a GI doctor but when she called her primary care doctor to talk about this today she was told to come to the ED.  She denies any current urinary symptoms.  No chest pain or shortness of breath.  Past Medical History:  Diagnosis Date  . Anxiety   . Arthritis   . COPD (chronic obstructive pulmonary disease) ([place])   . Hypoglycemia   . Insomnia   . Kidney cysts   . Multiple fractures of ribs of right side May 1982   multiple rib fracture and repair with plates and wires    Patient Active Problem List   Diagnosis Date Noted  . Adrenal adenoma, left 09/03/2016  . CAD (coronary atherosclerotic disease) 09/03/2016  . Smoker 07/29/2016  . Moderate asthma 07/29/2016  . Anxiety 07/29/2016  . History of hysterectomy 07/29/2016  .  Arthritis 07/29/2016  . COPD (chronic obstructive pulmonary disease) with emphysema ([place]) 07/29/2016    Past Surgical History:  Procedure Laterality Date  . ABDOMINAL HYSTERECTOMY    . KIDNEY SURGERY    . RIB FRACTURE SURGERY  1982    OB History    No data available       Home Medications    Prior to Admission medications   Medication Sig Start Date End Date Taking? Authorizing Provider  albuterol (PROVENTIL) (2.5 MG/3ML) 0.083% nebulizer solution Take 3 mLs (2.5 mg total) by nebulization every 6 (six) hours as needed for wheezing or shortness of breath. 07/26/16   Briscoe Deutscher, DO  diazepam (VALIUM) 10 MG tablet Take 15 mg by mouth daily. 07/13/16   [provider]  Ipratropium-Albuterol (COMBIVENT) 20-100 MCG/ACT AERS respimat Inhale 1 puff into the lungs 4 (four) times daily as needed. 07/26/16   Briscoe Deutscher, DO  QUEtiapine (SEROQUEL) 100 MG tablet Take 100 mg by mouth at bedtime. 06/08/16   [provider]  sertraline (ZOLOFT) 100 MG tablet Take 100 mg by mouth daily. 06/25/16   [provider]    Family History History reviewed. No pertinent family history.  Social History Social History   Tobacco Use  . Smoking status: Current Every Day Smoker    Packs/day: 0.50  Years: 42.00    Pack years: 21.00    Types: Cigarettes  . Smokeless tobacco: Never Used  Substance Use Topics  . Alcohol use: Yes    Comment: occassionally  . Drug use: No     Allergies   Hydrocodone and Sulfa antibiotics   Review of Systems Review of Systems  Gastrointestinal: Positive for constipation.  All other systems reviewed and are negative.    Physical Exam Updated Vital Signs BP (!) 144/84 (BP Location: Left Arm)   Pulse 100   Temp 98.3 F (36.8 C) (Oral)   Resp 18   Ht 5\' 4"  (1.626 m)   Wt 60.3 kg (133 lb)   SpO2 95%   BMI 22.83 kg/m   Physical Exam  Constitutional: She is oriented to person, place, and time. She appears well-developed and  well-nourished.  HENT:  Head: Normocephalic and atraumatic.  Mouth/Throat: Oropharynx is clear and moist.  Eyes: Conjunctivae and EOM are normal. Pupils are equal, round, and reactive to light.  Neck: Normal range of motion.  Cardiovascular: Normal rate, regular rhythm and normal heart sounds.  Pulmonary/Chest: Effort normal and breath sounds normal.  Abdominal: Soft. Bowel sounds are normal. There is no tenderness. There is no rigidity and no guarding.  Soft, nontender, nondistended, normal bowel sounds  Musculoskeletal: Normal range of motion.  Neurological: She is alert and oriented to person, place, and time.  Skin: Skin is warm and dry.  Psychiatric: She has a normal mood and affect.  Nursing note and vitals reviewed.    ED Treatments / Results  Labs (all labs ordered are listed, but only abnormal results are displayed) Labs Reviewed  COMPREHENSIVE METABOLIC PANEL - Abnormal; Notable for the following components:      Result Value   Calcium 10.8 (*)    All other components within normal limits  I-STAT BETA HCG BLOOD, ED ([facility], [facility], [facility] ONLY) - Abnormal; Notable for the following components:   I-stat hCG, quantitative 5.8 (*)    All other components within normal limits  LIPASE, BLOOD  CBC  URINALYSIS, ROUTINE W REFLEX MICROSCOPIC    EKG  EKG Interpretation None       Radiology Dg Abd Acute W/chest  Result Date: 05/03/2017 CLINICAL DATA:  Constipation for 3 months. Lower abdominal pain and reflux. EXAM: DG ABDOMEN ACUTE W/ 1V CHEST COMPARISON:  CT chest 08/29/2016.  Chest radiograph 07/26/2016. FINDINGS: Normal heart size and pulmonary vascularity. Postoperative changes in the right chest wall with rib deformities, cerclage wires, and plates. Scarring in the right upper lung and right mid lung. No airspace disease or consolidation in the lungs. No blunting of costophrenic angles. No pneumothorax. Mediastinal contours appear intact. Gas and stool throughout the colon. No  small or large bowel distention. No free intra-abdominal air. No abnormal air-fluid levels. 5 mm stone projected over the midpole left kidney suggesting a renal stone. Degenerative changes in the spine and hips. IMPRESSION: 1. No evidence of active pulmonary disease. Postoperative changes in the right chest wall with scarring in the right lung. 2. Nonobstructive bowel gas pattern with diffusely stool-filled colon. 3. 5 mm stone projected over the left kidney probably represents a renal stone. Electronically Signed   By: Lucienne Capers M.D.   On: 05/03/2017 01:49    Procedures Procedures (including critical care time)  Medications Ordered in ED Medications - No data to display   Initial Impression / Assessment and Plan / ED Course  I have reviewed the triage vital signs and  the nursing notes.  Pertinent labs & imaging results that were available during my care of the patient were reviewed by me and considered in my medical decision making (see chart for details).  62 year old female presenting to the ED with abdominal pain.  Notably she has had difficulty with bowel movements of the past 3 months as well as some acid reflux symptoms.  She screening labs overall reassuring.  HCG is elevated at 5.8, however patient is status post hysterectomy.  She has no abdominal distention or tenderness on exam.  Denies any current GERD symptoms.  Acute abdominal series was obtained revealing findings of stool burden.  Patient has been resting comfortably here.  Low suspicion for acute obstructive process.  Will start daily MiraLAX as well as Pepcid for her acid reflux.  She has had some intermittent issues with swallowing meat but has been able to tolerate fluids here without issue.  I do feel she would benefit from GI follow-up, given information for group on-call.  Close follow-up with PCP as well.  She understands to return here for any new or acute changes.  Final Clinical Impressions(s) / ED Diagnoses    Final diagnoses:  Generalized abdominal pain  Gastroesophageal reflux disease without esophagitis    ED Discharge Orders        Ordered    famotidine (PEPCID) 20 MG tablet  2 times daily     05/03/17 0402    polyethylene glycol powder (GLYCOLAX/MIRALAX) powder  2 times daily     05/03/17 0402       Larene Pickett, PA-C 05/03/17 4825    Rolland Porter, MD 05/03/17 [phone]

## 2017-07-06 ENCOUNTER — Ambulatory Visit: Payer: Self-pay | Admitting: *Deleted

## 2017-07-06 NOTE — Telephone Encounter (Signed)
Pt calling with shortness of breath that she has been experiencing for the past couple of weeks.  Pt states she feels like she is worn "slap out". Pt states when she was at work she was on her knees and could not pick up the crate due to being short of breath and she is barely able to speak while talking on the phone with triage nurse. Pt also stating that she is having dizziness and had episodes of emesis last night. Pt states she started taking Zantac 2 days ago and has taken extra strength TUMS with no relief.  Pt asking to come in for appt for something stronger for acid reflux, which she believes is causing her current symptoms. Pt advised that she needed to be seen in the ED for current symptoms. Pt refusing to go to the ED stating that they did not due anything for her when she went last year except give her medication for acid reflux. Pt encouraged again to go to the ED, but pt states she can not go to the ED because it is too expensive and the only day off of work she has is on Tuesday. Pt advised again to be seen before Tuesday but the pt still is refusing to get treatment in the ED.  Reason for Disposition . [1] MODERATE difficulty breathing (e.g., speaks in phrases, SOB even at rest, pulse 100-120) AND [2] NEW-onset or WORSE than normal  Answer Assessment - Initial Assessment Questions 1. RESPIRATORY STATUS: "Describe your breathing?" (e.g., wheezing, shortness of breath, unable to speak, severe coughing)      Shortness of breath 2. ONSET: "When did this breathing problem begin?"      For the past couple of weeks 3. PATTERN "Does the difficult breathing come and go, or has it been constant since it started?"     Comes and goes with activity 4. SEVERITY: "How bad is your breathing?" (e.g., mild, moderate, severe)    - MILD: No SOB at rest, mild SOB with walking, speaks normally in sentences, can lay down, no retractions, pulse < 100.    - MODERATE: SOB at rest, SOB with minimal exertion and  prefers to sit, cannot lie down flat, speaks in phrases, mild retractions, audible wheezing, pulse 100-120.    - SEVERE: Very SOB at rest, speaks in single words, struggling to breathe, sitting hunched forward, retractions, pulse > 120      Moderate, could not pick up crates at work due to feeling SOB 5. RECURRENT SYMPTOM: "Have you had difficulty breathing before?" If so, ask: "When was the last time?" and "What happened that time?"      Yes, pt has a history of  COPD 6. CARDIAC HISTORY: "Do you have any history of heart disease?" (e.g., heart attack, angina, bypass surgery, angioplasty)      Not able to assess 7. LUNG HISTORY: "Do you have any history of lung disease?"  (e.g., pulmonary embolus, asthma, emphysema)     Hx of COPD 8. CAUSE: "What do you think is causing the breathing problem?"      Thinks it is caused by acid reflux 9. OTHER SYMPTOMS: "Do you have any other symptoms? (e.g., dizziness, runny nose, cough, chest pain, fever)     Dizziness, emesis last night 10. PREGNANCY: "Is there any chance you are pregnant?" "When was your last menstrual period?"       Not able to assess 11. TRAVEL: "Have you traveled out of the country in the last month?" (  e.g., travel history, exposures) Not able to assess  Protocols used: BREATHING DIFFICULTY-A-AH

## 2017-07-09 ENCOUNTER — Telehealth: Payer: Self-pay

## 2017-07-09 NOTE — Telephone Encounter (Signed)
FYI

## 2017-07-09 NOTE — Telephone Encounter (Signed)
Please advise 

## 2017-07-09 NOTE — Telephone Encounter (Signed)
Called patient to make sure that she had been given the message from Dr. Juleen China. Patient became very irate with me telling me "you are going to listen to what the fuc* I have to say" She became very aggressive yelling and cussing to the point where I could not re direct the conversation to be able to help solve the issue. Patient informed me that she has app with GI in few days and she will see them. She refused to go to ED to get 1,000 dollar bill a prescription for for acid reflux and no refills. She continued to cuss at me until I informed patient that I would document her response and let Dr. Juleen China know and ended the call.

## 2017-07-09 NOTE — Telephone Encounter (Signed)
I have not seen this patient in a year.  She absolutely needs to go to the emergency room based on the symptoms I see in the triage note.  She is at risk for a cardiac event at this point.  I will not see her in the office for this issue - she must go to the emergency room.

## 2017-07-09 NOTE — Telephone Encounter (Signed)
Copied from [place] [phone]. Topic: General - Other >> Jul 09, 2017  2:11 PM Yvette Rack wrote: Reason for CRM: patient calling to let Dr Juleen China know that she has a OV  at a GI doctor for GERD and that they will contact Juleen China about patient

## 2017-07-10 ENCOUNTER — Other Ambulatory Visit: Payer: Self-pay | Admitting: Family Medicine

## 2017-07-10 ENCOUNTER — Ambulatory Visit: Payer: BLUE CROSS/BLUE SHIELD | Admitting: Family Medicine

## 2017-07-10 DIAGNOSIS — J45909 Unspecified asthma, uncomplicated: Secondary | ICD-10-CM

## 2017-07-19 ENCOUNTER — Other Ambulatory Visit: Payer: Self-pay | Admitting: Gastroenterology

## 2017-07-19 DIAGNOSIS — R112 Nausea with vomiting, unspecified: Secondary | ICD-10-CM

## 2017-07-26 ENCOUNTER — Other Ambulatory Visit: Payer: BLUE CROSS/BLUE SHIELD

## 2017-07-26 ENCOUNTER — Encounter: Payer: Self-pay | Admitting: Family Medicine

## 2017-07-27 ENCOUNTER — Telehealth: Payer: Self-pay | Admitting: Family Medicine

## 2017-07-27 NOTE — Telephone Encounter (Signed)
Patient dismissed from [facility] by Briscoe Deutscher DO, effective July 26, 2017. Dismissal letter sent out by certified / registered mail.  daj

## 2017-08-03 ENCOUNTER — Ambulatory Visit
Admission: RE | Admit: 2017-08-03 | Discharge: 2017-08-03 | Disposition: A | Discharge status: Home / Self Care | Payer: BLUE CROSS/BLUE SHIELD | Source: Ambulatory Visit | Attending: Gastroenterology | Admitting: Gastroenterology

## 2017-08-03 DIAGNOSIS — R112 Nausea with vomiting, unspecified: Secondary | ICD-10-CM

## 2017-08-06 NOTE — Telephone Encounter (Signed)
Received signed domestic return receipt verifying delivery of certified mail on 7/57/97 article number [phone]. cgn

## 2017-08-10 ENCOUNTER — Other Ambulatory Visit: Payer: Self-pay | Admitting: Gastroenterology

## 2017-08-10 ENCOUNTER — Ambulatory Visit
Admission: RE | Admit: 2017-08-10 | Discharge: 2017-08-10 | Disposition: A | Discharge status: Home / Self Care | Payer: BLUE CROSS/BLUE SHIELD | Source: Ambulatory Visit | Attending: Gastroenterology | Admitting: Gastroenterology

## 2017-08-10 DIAGNOSIS — K5909 Other constipation: Secondary | ICD-10-CM

## 2017-09-23 ENCOUNTER — Other Ambulatory Visit: Payer: Self-pay | Admitting: Family Medicine

## 2017-09-23 DIAGNOSIS — J45909 Unspecified asthma, uncomplicated: Secondary | ICD-10-CM

## 2017-10-19 ENCOUNTER — Other Ambulatory Visit: Payer: Self-pay | Admitting: Gastroenterology

## 2017-10-19 ENCOUNTER — Ambulatory Visit
Admission: RE | Admit: 2017-10-19 | Discharge: 2017-10-19 | Disposition: A | Discharge status: Home / Self Care | Payer: BLUE CROSS/BLUE SHIELD | Source: Ambulatory Visit | Attending: Gastroenterology | Admitting: Gastroenterology

## 2017-10-19 DIAGNOSIS — R1084 Generalized abdominal pain: Secondary | ICD-10-CM

## 2017-10-24 ENCOUNTER — Other Ambulatory Visit: Payer: Self-pay | Admitting: Gastroenterology

## 2017-10-24 DIAGNOSIS — N200 Calculus of kidney: Secondary | ICD-10-CM

## 2017-10-30 ENCOUNTER — Ambulatory Visit
Admission: RE | Admit: 2017-10-30 | Discharge: 2017-10-30 | Disposition: A | Discharge status: Home / Self Care | Payer: BLUE CROSS/BLUE SHIELD | Source: Ambulatory Visit | Attending: Gastroenterology | Admitting: Gastroenterology

## 2017-10-30 DIAGNOSIS — N200 Calculus of kidney: Secondary | ICD-10-CM

## 2017-11-01 ENCOUNTER — Other Ambulatory Visit: Payer: Self-pay | Admitting: Gastroenterology

## 2017-12-15 ENCOUNTER — Emergency Department (HOSPITAL_COMMUNITY): Payer: BLUE CROSS/BLUE SHIELD

## 2017-12-15 ENCOUNTER — Encounter: Payer: Self-pay | Admitting: *Deleted

## 2017-12-15 ENCOUNTER — Encounter (HOSPITAL_COMMUNITY): Payer: Self-pay | Admitting: Emergency Medicine

## 2017-12-15 ENCOUNTER — Emergency Department (HOSPITAL_COMMUNITY)
Admission: EM | Admit: 2017-12-15 | Discharge: 2017-12-15 | Disposition: A | Discharge status: Home / Self Care | Payer: BLUE CROSS/BLUE SHIELD | Attending: Emergency Medicine | Admitting: Emergency Medicine

## 2017-12-15 DIAGNOSIS — Y939 Activity, unspecified: Secondary | ICD-10-CM | POA: Diagnosis not present

## 2017-12-15 DIAGNOSIS — S71122A Laceration with foreign body, left thigh, initial encounter: Secondary | ICD-10-CM | POA: Insufficient documentation

## 2017-12-15 DIAGNOSIS — Z79899 Other long term (current) drug therapy: Secondary | ICD-10-CM | POA: Insufficient documentation

## 2017-12-15 DIAGNOSIS — Z1881 Retained glass fragments: Secondary | ICD-10-CM | POA: Insufficient documentation

## 2017-12-15 DIAGNOSIS — F17200 Nicotine dependence, unspecified, uncomplicated: Secondary | ICD-10-CM | POA: Diagnosis not present

## 2017-12-15 DIAGNOSIS — S81021A Laceration with foreign body, right knee, initial encounter: Secondary | ICD-10-CM | POA: Insufficient documentation

## 2017-12-15 DIAGNOSIS — Y999 Unspecified external cause status: Secondary | ICD-10-CM | POA: Insufficient documentation

## 2017-12-15 DIAGNOSIS — S52502A Unspecified fracture of the lower end of left radius, initial encounter for closed fracture: Secondary | ICD-10-CM

## 2017-12-15 DIAGNOSIS — Y929 Unspecified place or not applicable: Secondary | ICD-10-CM | POA: Diagnosis not present

## 2017-12-15 DIAGNOSIS — Z23 Encounter for immunization: Secondary | ICD-10-CM | POA: Insufficient documentation

## 2017-12-15 DIAGNOSIS — S71112A Laceration without foreign body, left thigh, initial encounter: Secondary | ICD-10-CM

## 2017-12-15 DIAGNOSIS — S81011A Laceration without foreign body, right knee, initial encounter: Secondary | ICD-10-CM

## 2017-12-15 DIAGNOSIS — S8991XA Unspecified injury of right lower leg, initial encounter: Secondary | ICD-10-CM | POA: Diagnosis present

## 2017-12-15 DIAGNOSIS — S52592A Other fractures of lower end of left radius, initial encounter for closed fracture: Secondary | ICD-10-CM | POA: Insufficient documentation

## 2017-12-15 DIAGNOSIS — S80251A Superficial foreign body, right knee, initial encounter: Secondary | ICD-10-CM

## 2017-12-15 LAB — I-STAT CHEM 8, ED
BUN: 9 mg/dL (ref 8–23)
Calcium, Ion: 1.26 mmol/L (ref 1.15–1.40)
Chloride: 105 mmol/L (ref 98–111)
Creatinine, Ser: 0.7 mg/dL (ref 0.44–1.00)
Glucose, Bld: 98 mg/dL (ref 70–99)
HCT: 41 % (ref 36.0–46.0)
Hemoglobin: 13.9 g/dL (ref 12.0–15.0)
Potassium: 3.7 mmol/L (ref 3.5–5.1)
Sodium: 137 mmol/L (ref 135–145)
TCO2: 22 mmol/L (ref 22–32)

## 2017-12-15 LAB — URINALYSIS, ROUTINE W REFLEX MICROSCOPIC
Bilirubin Urine: NEGATIVE
Glucose, UA: NEGATIVE mg/dL
Ketones, ur: 20 mg/dL — AB
Leukocytes, UA: NEGATIVE
Nitrite: NEGATIVE
Protein, ur: 30 mg/dL — AB
Specific Gravity, Urine: 1.004 — ABNORMAL LOW (ref 1.005–1.030)
pH: 8 (ref 5.0–8.0)

## 2017-12-15 LAB — CBC
HCT: 41.7 % (ref 36.0–46.0)
Hemoglobin: 14.2 g/dL (ref 12.0–15.0)
MCH: 31.2 pg (ref 26.0–34.0)
MCHC: 34.1 g/dL (ref 30.0–36.0)
MCV: 91.6 fL (ref 78.0–100.0)
Platelets: 310 10*3/uL (ref 150–400)
RBC: 4.55 MIL/uL (ref 3.87–5.11)
RDW: 12.2 % (ref 11.5–15.5)
WBC: 8.7 10*3/uL (ref 4.0–10.5)

## 2017-12-15 LAB — SAMPLE TO BLOOD BANK

## 2017-12-15 LAB — COMPREHENSIVE METABOLIC PANEL
ALT: 20 U/L (ref 0–44)
AST: 23 U/L (ref 15–41)
Albumin: 4.4 g/dL (ref 3.5–5.0)
Alkaline Phosphatase: 88 U/L (ref 38–126)
Anion gap: 13 (ref 5–15)
BUN: 8 mg/dL (ref 8–23)
CO2: 21 mmol/L — ABNORMAL LOW (ref 22–32)
Calcium: 10.5 mg/dL — ABNORMAL HIGH (ref 8.9–10.3)
Chloride: 101 mmol/L (ref 98–111)
Creatinine, Ser: 0.82 mg/dL (ref 0.44–1.00)
GFR calc Af Amer: >60 mL/min (ref >60)
GFR calc non Af Amer: >60 mL/min (ref >60)
Glucose, Bld: 94 mg/dL (ref 70–99)
Potassium: 3.7 mmol/L (ref 3.5–5.1)
Sodium: 135 mmol/L (ref 135–145)
Total Bilirubin: 0.9 mg/dL (ref 0.3–1.2)
Total Protein: 6.7 g/dL (ref 6.5–8.1)

## 2017-12-15 LAB — I-STAT CG4 LACTIC ACID, ED: Lactic Acid, Venous: 1.59 mmol/L (ref 0.5–1.9)

## 2017-12-15 LAB — PROTIME-INR
INR: 0.89
Prothrombin Time: 12 seconds (ref 11.4–15.2)

## 2017-12-15 LAB — ETHANOL: Alcohol, Ethyl (B): <10 mg/dL (ref <10)

## 2017-12-15 LAB — CDS SEROLOGY

## 2017-12-15 MED ORDER — CEPHALEXIN 250 MG PO CAPS
500.0000 mg | ORAL_CAPSULE | Freq: Once | ORAL | Status: AC
  Administered 2017-12-15: 500 mg via ORAL
  Filled 2017-12-15: qty 2

## 2017-12-15 MED ORDER — CEPHALEXIN 500 MG PO CAPS: 500.0000 mg | ORAL_CAPSULE | Freq: Three times a day (TID) | ORAL | 0 refills | Status: DC

## 2017-12-15 MED ORDER — OXYCODONE-ACETAMINOPHEN 5-325 MG PO TABS: 1.0000 | ORAL_TABLET | ORAL | 0 refills | Status: DC | PRN

## 2017-12-15 MED ORDER — MORPHINE SULFATE (PF) 4 MG/ML IV SOLN
INTRAVENOUS | Status: AC
  Filled 2017-12-15: qty 1

## 2017-12-15 MED ORDER — LIDOCAINE-EPINEPHRINE (PF) 2 %-1:200000 IJ SOLN
40.0000 mL | Freq: Once | INTRAMUSCULAR | Status: AC
  Administered 2017-12-15: 40 mL
  Filled 2017-12-15: qty 40

## 2017-12-15 MED ORDER — MORPHINE SULFATE (PF) 4 MG/ML IV SOLN
4.0000 mg | Freq: Once | INTRAVENOUS | Status: AC
  Administered 2017-12-15: 4 mg via INTRAVENOUS
  Filled 2017-12-15: qty 1

## 2017-12-15 MED ORDER — MORPHINE SULFATE (PF) 4 MG/ML IV SOLN
4.0000 mg | Freq: Once | INTRAVENOUS | Status: AC
  Administered 2017-12-15: 4 mg via INTRAVENOUS

## 2017-12-15 MED ORDER — TETANUS-DIPHTH-ACELL PERTUSSIS 5-2.5-18.5 LF-MCG/0.5 IM SUSP
0.5000 mL | Freq: Once | INTRAMUSCULAR | Status: AC
  Administered 2017-12-15: 0.5 mL via INTRAMUSCULAR
  Filled 2017-12-15: qty 0.5

## 2017-12-15 NOTE — ED Notes (Signed)
Xray tech at bedside.

## 2017-12-15 NOTE — ED Notes (Signed)
X RAY at bedside 

## 2017-12-15 NOTE — Progress Notes (Signed)
Orthopedic Tech Progress Note Patient Details:  [name] [facility] [birth year] [id number]  Patient ID: [name], female   DOB: Jan 09, 1956, 62 y.o.   MRN: [id number]   [name] 12/15/2017, 4:34 PM Made level 2 trauma visit

## 2017-12-15 NOTE — ED Notes (Signed)
Pt left at this time with all belongings.  

## 2017-12-15 NOTE — Progress Notes (Signed)
Orthopedic Tech Progress Note Patient Details:  [name] 04/03/1874 [id number]  Patient ID: [name], female   DOB: 04/03/1874, 62 y.o.   MRN: [id number]   [name] 12/15/2017, 4:19 PM Made level 2 trauma visit

## 2017-12-15 NOTE — Progress Notes (Signed)
I responded to a Level 2 Trauma Alert, was not able to talk with the patient directly. I remain available for spiritual support. No family members present. The Chaplain was available as needed or requested.    12/15/17 1640  Clinical Encounter Type  Visited With Patient not available  Visit Type Spiritual support  Referral From Nurse  Consult/Referral To Chaplain  Spiritual Encounters  Spiritual Needs Prayer;Emotional  Stress Factors  Patient Stress Factors None identified    Chaplain Dr Redgie Grayer
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
## 2017-12-15 NOTE — Discharge Instructions (Signed)
Staple removal in 14 days  Call Dr Fredna Dow for follow up

## 2017-12-17 ENCOUNTER — Encounter (HOSPITAL_COMMUNITY): Payer: Self-pay

## 2017-12-20 NOTE — ED Notes (Signed)
12/20/2017,  Pt. Called and left a message on this RN 's office mail for return call with questions about her Staple Removal.  Called pt. Back, no answer, left message.

## 2017-12-26 ENCOUNTER — Inpatient Hospital Stay (HOSPITAL_COMMUNITY)
Admission: EM | Admit: 2017-12-26 | Discharge: 2018-01-02 | DRG: 902 | Disposition: A | Discharge status: Skilled Nursing Facility | Payer: BLUE CROSS/BLUE SHIELD | Attending: Internal Medicine | Admitting: Internal Medicine

## 2017-12-26 ENCOUNTER — Emergency Department (HOSPITAL_COMMUNITY): Payer: BLUE CROSS/BLUE SHIELD

## 2017-12-26 ENCOUNTER — Encounter (HOSPITAL_COMMUNITY): Payer: Self-pay | Admitting: Emergency Medicine

## 2017-12-26 DIAGNOSIS — Z886 Allergy status to analgesic agent status: Secondary | ICD-10-CM

## 2017-12-26 DIAGNOSIS — L089 Local infection of the skin and subcutaneous tissue, unspecified: Secondary | ICD-10-CM | POA: Diagnosis present

## 2017-12-26 DIAGNOSIS — Z885 Allergy status to narcotic agent status: Secondary | ICD-10-CM

## 2017-12-26 DIAGNOSIS — T8130XA Disruption of wound, unspecified, initial encounter: Secondary | ICD-10-CM

## 2017-12-26 DIAGNOSIS — L7634 Postprocedural seroma of skin and subcutaneous tissue following other procedure: Secondary | ICD-10-CM | POA: Diagnosis present

## 2017-12-26 DIAGNOSIS — T148XXA Other injury of unspecified body region, initial encounter: Secondary | ICD-10-CM | POA: Diagnosis not present

## 2017-12-26 DIAGNOSIS — F419 Anxiety disorder, unspecified: Secondary | ICD-10-CM | POA: Diagnosis not present

## 2017-12-26 DIAGNOSIS — I96 Gangrene, not elsewhere classified: Secondary | ICD-10-CM | POA: Diagnosis present

## 2017-12-26 DIAGNOSIS — S62102S Fracture of unspecified carpal bone, left wrist, sequela: Secondary | ICD-10-CM | POA: Diagnosis not present

## 2017-12-26 DIAGNOSIS — Z882 Allergy status to sulfonamides status: Secondary | ICD-10-CM

## 2017-12-26 DIAGNOSIS — Z881 Allergy status to other antibiotic agents status: Secondary | ICD-10-CM

## 2017-12-26 DIAGNOSIS — J439 Emphysema, unspecified: Secondary | ICD-10-CM | POA: Diagnosis not present

## 2017-12-26 DIAGNOSIS — F17210 Nicotine dependence, cigarettes, uncomplicated: Secondary | ICD-10-CM | POA: Diagnosis present

## 2017-12-26 DIAGNOSIS — G4700 Insomnia, unspecified: Secondary | ICD-10-CM | POA: Diagnosis present

## 2017-12-26 DIAGNOSIS — L03116 Cellulitis of left lower limb: Secondary | ICD-10-CM

## 2017-12-26 DIAGNOSIS — Z87891 Personal history of nicotine dependence: Secondary | ICD-10-CM

## 2017-12-26 DIAGNOSIS — T8133XA Disruption of traumatic injury wound repair, initial encounter: Secondary | ICD-10-CM | POA: Diagnosis not present

## 2017-12-26 DIAGNOSIS — Z90710 Acquired absence of both cervix and uterus: Secondary | ICD-10-CM

## 2017-12-26 DIAGNOSIS — M1990 Unspecified osteoarthritis, unspecified site: Secondary | ICD-10-CM | POA: Diagnosis present

## 2017-12-26 DIAGNOSIS — S52502D Unspecified fracture of the lower end of left radius, subsequent encounter for closed fracture with routine healing: Secondary | ICD-10-CM

## 2017-12-26 LAB — COMPREHENSIVE METABOLIC PANEL
ALT: 18 U/L (ref 0–44)
AST: 23 U/L (ref 15–41)
Albumin: 3.9 g/dL (ref 3.5–5.0)
Alkaline Phosphatase: 93 U/L (ref 38–126)
Anion gap: 9 (ref 5–15)
BUN: 12 mg/dL (ref 8–23)
CO2: 22 mmol/L (ref 22–32)
Calcium: 10.3 mg/dL (ref 8.9–10.3)
Chloride: 108 mmol/L (ref 98–111)
Creatinine, Ser: 1.08 mg/dL — ABNORMAL HIGH (ref 0.44–1.00)
GFR calc Af Amer: >60 mL/min (ref >60)
GFR calc non Af Amer: 54 mL/min — ABNORMAL LOW (ref >60)
Glucose, Bld: 111 mg/dL — ABNORMAL HIGH (ref 70–99)
Potassium: 3.6 mmol/L (ref 3.5–5.1)
Sodium: 139 mmol/L (ref 135–145)
Total Bilirubin: 0.4 mg/dL (ref 0.3–1.2)
Total Protein: 7 g/dL (ref 6.5–8.1)

## 2017-12-26 LAB — I-STAT CG4 LACTIC ACID, ED
Lactic Acid, Venous: 1.37 mmol/L (ref 0.5–1.9)
Lactic Acid, Venous: 0.94 mmol/L (ref 0.5–1.9)

## 2017-12-26 LAB — CBC WITH DIFFERENTIAL/PLATELET
Abs Immature Granulocytes: 0.1 10*3/uL (ref 0.0–0.1)
Basophils Absolute: 0.1 10*3/uL (ref 0.0–0.1)
Basophils Relative: 1 %
Eosinophils Absolute: 0.2 10*3/uL (ref 0.0–0.7)
Eosinophils Relative: 2 %
HCT: 37.6 % (ref 36.0–46.0)
Hemoglobin: 12.3 g/dL (ref 12.0–15.0)
Immature Granulocytes: 1 %
Lymphocytes Relative: 26 %
Lymphs Abs: 2.7 10*3/uL (ref 0.7–4.0)
MCH: 31.2 pg (ref 26.0–34.0)
MCHC: 32.7 g/dL (ref 30.0–36.0)
MCV: 95.4 fL (ref 78.0–100.0)
Monocytes Absolute: 0.9 10*3/uL (ref 0.1–1.0)
Monocytes Relative: 9 %
Neutro Abs: 6.4 10*3/uL (ref 1.7–7.7)
Neutrophils Relative %: 61 %
Platelets: 404 10*3/uL — ABNORMAL HIGH (ref 150–400)
RBC: 3.94 MIL/uL (ref 3.87–5.11)
RDW: 12.5 % (ref 11.5–15.5)
WBC: 10.3 10*3/uL (ref 4.0–10.5)

## 2017-12-26 MED ORDER — HYDROMORPHONE HCL 1 MG/ML IJ SOLN
0.5000 mg | INTRAMUSCULAR | Status: DC | PRN
  Administered 2017-12-27 – 2017-12-28 (×5): 1 mg via INTRAVENOUS
  Filled 2017-12-26 (×5): qty 1

## 2017-12-26 MED ORDER — VANCOMYCIN HCL 10 G IV SOLR
1250.0000 mg | Freq: Once | INTRAVENOUS | Status: AC
  Administered 2017-12-26: 1250 mg via INTRAVENOUS
  Filled 2017-12-26: qty 1250

## 2017-12-26 MED ORDER — PIPERACILLIN-TAZOBACTAM 3.375 G IVPB
3.3750 g | Freq: Three times a day (TID) | INTRAVENOUS | Status: DC
  Administered 2017-12-27 – 2017-12-29 (×8): 3.375 g via INTRAVENOUS
  Filled 2017-12-26 (×9): qty 50

## 2017-12-26 MED ORDER — IPRATROPIUM-ALBUTEROL 0.5-2.5 (3) MG/3ML IN SOLN: 3.0000 mL | Freq: Four times a day (QID) | RESPIRATORY_TRACT | Status: DC | PRN

## 2017-12-26 MED ORDER — ACETAMINOPHEN 325 MG PO TABS: 650.0000 mg | ORAL_TABLET | Freq: Four times a day (QID) | ORAL | Status: DC | PRN

## 2017-12-26 MED ORDER — ONDANSETRON HCL 4 MG PO TABS
4.0000 mg | ORAL_TABLET | Freq: Four times a day (QID) | ORAL | Status: DC | PRN
  Filled 2017-12-26: qty 1

## 2017-12-26 MED ORDER — VANCOMYCIN HCL 500 MG IV SOLR
500.0000 mg | Freq: Two times a day (BID) | INTRAVENOUS | Status: DC
  Administered 2017-12-27 – 2017-12-29 (×5): 500 mg via INTRAVENOUS
  Filled 2017-12-26 (×5): qty 500

## 2017-12-26 MED ORDER — SODIUM CHLORIDE 0.9 % IV BOLUS
1000.0000 mL | Freq: Once | INTRAVENOUS | Status: AC
  Administered 2017-12-26: 1000 mL via INTRAVENOUS

## 2017-12-26 MED ORDER — PIPERACILLIN-TAZOBACTAM 3.375 G IVPB 30 MIN
3.3750 g | Freq: Once | INTRAVENOUS | Status: AC
  Administered 2017-12-26: 3.375 g via INTRAVENOUS
  Filled 2017-12-26: qty 50

## 2017-12-26 MED ORDER — QUETIAPINE FUMARATE 100 MG PO TABS
100.0000 mg | ORAL_TABLET | Freq: Every evening | ORAL | Status: DC | PRN
  Administered 2017-12-29 – 2017-12-31 (×3): 100 mg via ORAL
  Filled 2017-12-26 (×3): qty 1

## 2017-12-26 MED ORDER — ONDANSETRON HCL 4 MG/2ML IJ SOLN
4.0000 mg | Freq: Four times a day (QID) | INTRAMUSCULAR | Status: DC | PRN
  Administered 2017-12-27 – 2017-12-28 (×2): 4 mg via INTRAVENOUS
  Filled 2017-12-26 (×2): qty 2

## 2017-12-26 MED ORDER — ALPRAZOLAM 0.5 MG PO TABS: 0.5000 mg | ORAL_TABLET | Freq: Every day | ORAL | Status: DC | PRN

## 2017-12-26 MED ORDER — SENNOSIDES-DOCUSATE SODIUM 8.6-50 MG PO TABS: 1.0000 | ORAL_TABLET | Freq: Every evening | ORAL | Status: DC | PRN

## 2017-12-26 MED ORDER — SODIUM CHLORIDE 0.9 % IV SOLN
INTRAVENOUS | Status: DC
  Administered 2017-12-26 – 2017-12-27 (×2): via INTRAVENOUS

## 2017-12-26 MED ORDER — HYDROMORPHONE HCL 1 MG/ML IJ SOLN
0.5000 mg | Freq: Once | INTRAMUSCULAR | Status: AC
  Administered 2017-12-26: 0.5 mg via INTRAVENOUS
  Filled 2017-12-26: qty 1

## 2017-12-26 MED ORDER — ACETAMINOPHEN 650 MG RE SUPP: 650.0000 mg | Freq: Four times a day (QID) | RECTAL | Status: DC | PRN

## 2017-12-26 MED ORDER — MORPHINE SULFATE (PF) 4 MG/ML IV SOLN
4.0000 mg | Freq: Once | INTRAVENOUS | Status: AC
  Administered 2017-12-26: 4 mg via INTRAVENOUS
  Filled 2017-12-26: qty 1

## 2017-12-26 MED ORDER — IOHEXOL 300 MG/ML  SOLN
100.0000 mL | Freq: Once | INTRAMUSCULAR | Status: AC | PRN
  Administered 2017-12-26: 100 mL via INTRAVENOUS
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
## 2017-12-26 NOTE — ED Notes (Signed)
ED Provider at bedside. 
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
## 2017-12-26 NOTE — Progress Notes (Signed)
Pharmacy Antibiotic Note  [name] is a 62 y.o. female admitted on 12/26/2017 with wound infection.  Pharmacy has been consulted for zosyn and vancomycin dosing. WBC 10.3, afebrile, sCr 1.08, CrCl~47  Plan: Vancomycin 1250mg  x1 then 500mg  Q12H Zosyn 3.375mg  Q8H F/u LOT/de-escalation, renal function, cultures, troughs prn     Temp (24hrs), Avg:98.7 F (37.1 C), Min:98.4 F (36.9 C), Max:98.9 F (37.2 C)  Recent Labs  Lab 12/26/17 1702 12/26/17 1711  WBC 10.3  --   CREATININE 1.08*  --   LATICACIDVEN  --  1.37    Estimated Creatinine Clearance: 47.2 mL/min (A) (by C-G formula based on SCr of 1.08 mg/dL (H)).    Allergies  Allergen Reactions  . Sulfa Antibiotics Nausea And Vomiting    Severe nausea/vomiting  . Hydrocodone Nausea And Vomiting  . Sulfa Antibiotics Nausea And Vomiting  . Hydrocodone-Acetaminophen Nausea And Vomiting    Oxycodone does not cause same reaction    Antimicrobials this admission: vanc 9/25 >>  Zosyn 9/25  >>   Dose adjustments this admission:   Microbiology results: 9/25 Bcx  Thank you for allowing pharmacy to be a part of this patient's care.  Harrietta Guardian, PharmD PGY1 Pharmacy Resident 12/26/2017    8:00 PM

## 2017-12-27 ENCOUNTER — Other Ambulatory Visit: Payer: Self-pay

## 2017-12-27 DIAGNOSIS — S62102S Fracture of unspecified carpal bone, left wrist, sequela: Secondary | ICD-10-CM | POA: Diagnosis not present

## 2017-12-27 DIAGNOSIS — L089 Local infection of the skin and subcutaneous tissue, unspecified: Secondary | ICD-10-CM | POA: Diagnosis not present

## 2017-12-27 DIAGNOSIS — T148XXA Other injury of unspecified body region, initial encounter: Secondary | ICD-10-CM | POA: Diagnosis not present

## 2017-12-27 DIAGNOSIS — R52 Pain, unspecified: Secondary | ICD-10-CM | POA: Diagnosis not present

## 2017-12-27 LAB — CBC WITH DIFFERENTIAL/PLATELET
Abs Immature Granulocytes: 0 10*3/uL (ref 0.0–0.1)
Basophils Absolute: 0.1 10*3/uL (ref 0.0–0.1)
Basophils Relative: 1 %
Eosinophils Absolute: 0.3 10*3/uL (ref 0.0–0.7)
Eosinophils Relative: 3 %
HCT: 33.6 % — ABNORMAL LOW (ref 36.0–46.0)
Hemoglobin: 11.1 g/dL — ABNORMAL LOW (ref 12.0–15.0)
Immature Granulocytes: 0 %
Lymphocytes Relative: 25 %
Lymphs Abs: 2.5 10*3/uL (ref 0.7–4.0)
MCH: 31.4 pg (ref 26.0–34.0)
MCHC: 33 g/dL (ref 30.0–36.0)
MCV: 95.2 fL (ref 78.0–100.0)
Monocytes Absolute: 0.9 10*3/uL (ref 0.1–1.0)
Monocytes Relative: 10 %
Neutro Abs: 6 10*3/uL (ref 1.7–7.7)
Neutrophils Relative %: 61 %
Platelets: 338 10*3/uL (ref 150–400)
RBC: 3.53 MIL/uL — ABNORMAL LOW (ref 3.87–5.11)
RDW: 12.8 % (ref 11.5–15.5)
WBC: 9.8 10*3/uL (ref 4.0–10.5)

## 2017-12-27 LAB — BASIC METABOLIC PANEL
Anion gap: 8 (ref 5–15)
BUN: 8 mg/dL (ref 8–23)
CO2: 19 mmol/L — ABNORMAL LOW (ref 22–32)
Calcium: 9.4 mg/dL (ref 8.9–10.3)
Chloride: 114 mmol/L — ABNORMAL HIGH (ref 98–111)
Creatinine, Ser: 0.73 mg/dL (ref 0.44–1.00)
GFR calc Af Amer: >60 mL/min (ref >60)
GFR calc non Af Amer: >60 mL/min (ref >60)
Glucose, Bld: 84 mg/dL (ref 70–99)
Potassium: 3.8 mmol/L (ref 3.5–5.1)
Sodium: 141 mmol/L (ref 135–145)

## 2017-12-27 LAB — SURGICAL PCR SCREEN
MRSA, PCR: NEGATIVE
Staphylococcus aureus: NEGATIVE

## 2017-12-27 LAB — HIV ANTIBODY (ROUTINE TESTING W REFLEX): HIV Screen 4th Generation wRfx: NONREACTIVE

## 2017-12-27 MED ORDER — FENTANYL CITRATE (PF) 100 MCG/2ML IJ SOLN
50.0000 ug | Freq: Once | INTRAMUSCULAR | Status: AC
  Administered 2017-12-27: 50 ug via INTRAVENOUS
  Filled 2017-12-27: qty 2

## 2017-12-27 MED ORDER — SODIUM CHLORIDE 0.9 % IV SOLN: INTRAVENOUS | Status: AC

## 2017-12-27 MED ORDER — OXYCODONE-ACETAMINOPHEN 5-325 MG PO TABS
1.0000 | ORAL_TABLET | Freq: Four times a day (QID) | ORAL | Status: DC | PRN
  Administered 2017-12-27 – 2017-12-28 (×2): 1 via ORAL
  Filled 2017-12-27 (×2): qty 1

## 2017-12-27 MED ORDER — COLLAGENASE 250 UNIT/GM EX OINT
TOPICAL_OINTMENT | Freq: Every day | CUTANEOUS | Status: DC
  Filled 2017-12-27: qty 30
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
## 2017-12-27 NOTE — Final Consult Note (Signed)
Discussed with MD. No role for debridement at this time. Continue wound care. Plastics recommending santyl BID and outpatient follow up. Recommend follow up with either plastics or wound care center. No other surgical needs identified, trauma will sign off at this time. Call with questions or concerns.  Brigid Re , [facility] 12/27/2017, 3:54 PM Pager: [phone] Mon-Fri 7:00 am-4:30 pm Sat-Sun 7:00 am-11:30 am
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
## 2017-12-27 NOTE — Progress Notes (Signed)
Spoke to Dr Hulen Skains this afternoon about dressing to Pt's left leg.  He requested the Xeroform cover the discolored area and then cover with ABD pad.  Dr. Eliseo Squires then ordered dressing to leg.  I advised her Dr. Richarda Blade instructions.  She deferred to Dr. Richarda Blade instructions, and asked that wound RN look at it tomorrow, and then give their recommendations.

## 2017-12-27 NOTE — Progress Notes (Signed)
Pharmacy Antibiotic Note  [name] is a 62 y.o. female admitted on 12/26/2017 with wound infection.  Pharmacy has been consulted for zosyn and vancomycin dosing. WBC wnl, afebrile.  SCr back down to 0.73, est CrCl 70 ml/min.   Plan: Change Vancomycin to 750mg  Q12H Zosyn 3.375mg  Q8H F/u LOT/de-escalation, renal function, cultures, troughs prn  Height: 5\' 4"  (162.6 cm) Weight: 148 lb 13 oz (67.5 kg) IBW/kg (Calculated) : 54.7  Temp (24hrs), Avg:98.3 F (36.8 C), Min:97.5 F (36.4 C), Max:98.9 F (37.2 C)  Recent Labs  Lab 12/26/17 1702 12/26/17 1711 12/26/17 2000 12/27/17 0541  WBC 10.3  --   --  9.8  CREATININE 1.08*  --   --  0.73  LATICACIDVEN  --  1.37 0.94  --     Estimated Creatinine Clearance: 69.7 mL/min (by C-G formula based on SCr of 0.73 mg/dL).    Allergies  Allergen Reactions  . Cephalexin Rash  . Sulfa Antibiotics Nausea And Vomiting    Severe nausea/vomiting/ GI upset  . Hydrocodone Nausea And Vomiting  . Sulfa Antibiotics Nausea And Vomiting  . Hydrocodone-Acetaminophen Nausea And Vomiting    Oxycodone does not cause same reaction    Antimicrobials this admission: vanc 9/25 >>  Zosyn 9/25  >>   Dose adjustments this admission:   Microbiology results: 9/25 Bcx:  Thank you for allowing pharmacy to be a part of this patient's care.  Sherlon Handing, PharmD, BCPS Clinical pharmacist  **Pharmacist phone directory can now be found on [email or web address] ([email or web address]).  Listed under [place]. 12/27/2017    10:07 AM
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
## 2017-12-28 DIAGNOSIS — T148XXA Other injury of unspecified body region, initial encounter: Secondary | ICD-10-CM | POA: Diagnosis not present

## 2017-12-28 DIAGNOSIS — F17210 Nicotine dependence, cigarettes, uncomplicated: Secondary | ICD-10-CM | POA: Diagnosis present

## 2017-12-28 DIAGNOSIS — Z886 Allergy status to analgesic agent status: Secondary | ICD-10-CM | POA: Diagnosis not present

## 2017-12-28 DIAGNOSIS — Z90710 Acquired absence of both cervix and uterus: Secondary | ICD-10-CM | POA: Diagnosis not present

## 2017-12-28 DIAGNOSIS — Z881 Allergy status to other antibiotic agents status: Secondary | ICD-10-CM | POA: Diagnosis not present

## 2017-12-28 DIAGNOSIS — Z885 Allergy status to narcotic agent status: Secondary | ICD-10-CM | POA: Diagnosis not present

## 2017-12-28 DIAGNOSIS — S62102S Fracture of unspecified carpal bone, left wrist, sequela: Secondary | ICD-10-CM | POA: Diagnosis not present

## 2017-12-28 DIAGNOSIS — S52502D Unspecified fracture of the lower end of left radius, subsequent encounter for closed fracture with routine healing: Secondary | ICD-10-CM | POA: Diagnosis not present

## 2017-12-28 DIAGNOSIS — L089 Local infection of the skin and subcutaneous tissue, unspecified: Secondary | ICD-10-CM | POA: Diagnosis not present

## 2017-12-28 DIAGNOSIS — Z882 Allergy status to sulfonamides status: Secondary | ICD-10-CM | POA: Diagnosis not present

## 2017-12-28 DIAGNOSIS — G4700 Insomnia, unspecified: Secondary | ICD-10-CM | POA: Diagnosis present

## 2017-12-28 DIAGNOSIS — T8130XA Disruption of wound, unspecified, initial encounter: Secondary | ICD-10-CM

## 2017-12-28 DIAGNOSIS — J439 Emphysema, unspecified: Secondary | ICD-10-CM | POA: Diagnosis present

## 2017-12-28 DIAGNOSIS — Z87891 Personal history of nicotine dependence: Secondary | ICD-10-CM | POA: Diagnosis not present

## 2017-12-28 DIAGNOSIS — L03116 Cellulitis of left lower limb: Secondary | ICD-10-CM | POA: Diagnosis present

## 2017-12-28 DIAGNOSIS — I96 Gangrene, not elsewhere classified: Secondary | ICD-10-CM | POA: Diagnosis present

## 2017-12-28 DIAGNOSIS — M1990 Unspecified osteoarthritis, unspecified site: Secondary | ICD-10-CM | POA: Diagnosis present

## 2017-12-28 DIAGNOSIS — L7634 Postprocedural seroma of skin and subcutaneous tissue following other procedure: Secondary | ICD-10-CM | POA: Diagnosis present

## 2017-12-28 DIAGNOSIS — T8133XA Disruption of traumatic injury wound repair, initial encounter: Secondary | ICD-10-CM | POA: Diagnosis present

## 2017-12-28 DIAGNOSIS — F419 Anxiety disorder, unspecified: Secondary | ICD-10-CM | POA: Diagnosis present

## 2017-12-28 MED ORDER — OXYCODONE-ACETAMINOPHEN 5-325 MG PO TABS
1.0000 | ORAL_TABLET | Freq: Four times a day (QID) | ORAL | Status: DC | PRN
  Administered 2017-12-28 (×2): 2 via ORAL
  Administered 2017-12-31: 1 via ORAL
  Administered 2018-01-02 (×2): 2 via ORAL
  Filled 2017-12-28: qty 2
  Filled 2017-12-28: qty 1
  Filled 2017-12-28 (×5): qty 2

## 2017-12-28 MED ORDER — SODIUM CHLORIDE 0.9 % IV SOLN
INTRAVENOUS | Status: AC
  Administered 2017-12-28: 11:00:00 via INTRAVENOUS

## 2017-12-28 MED ORDER — HYDROMORPHONE HCL 1 MG/ML IJ SOLN
0.5000 mg | INTRAMUSCULAR | Status: DC | PRN
  Administered 2017-12-28 – 2018-01-02 (×4): 0.5 mg via INTRAVENOUS
  Filled 2017-12-28 (×4): qty 1

## 2017-12-28 MED ORDER — COLLAGENASE 250 UNIT/GM EX OINT
TOPICAL_OINTMENT | Freq: Two times a day (BID) | CUTANEOUS | Status: DC
  Administered 2017-12-28 – 2017-12-31 (×7): via TOPICAL
  Administered 2017-12-31: 1 via TOPICAL
  Filled 2017-12-28: qty 30
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
## 2017-12-28 NOTE — Consult Note (Addendum)
WOC consult requested for left thigh wound.  Surgical team has assessed the site and plastics team performed a consult on 9/26 and ordered Santyl ointment BID for enzymatic debridement of nonviable tissue.  They recommended follow-up at the outpatient wound care center for further debridement as the wound continues to evolve after discharge.  This must be by physician referal and the case manager can assist with arrangements to obtain an appointment.  No further role for WOC team at this time. Please re-consult if further assistance is needed.  Thank-you,  Julien Girt MSN, [place], [place], [place], [place]
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
## 2017-12-29 DIAGNOSIS — L03116 Cellulitis of left lower limb: Secondary | ICD-10-CM

## 2017-12-29 MED ORDER — ALPRAZOLAM 0.5 MG PO TABS
1.0000 mg | ORAL_TABLET | Freq: Three times a day (TID) | ORAL | Status: DC | PRN
  Administered 2018-01-01: 0.5 mg via ORAL

## 2017-12-29 MED ORDER — DOXYCYCLINE HYCLATE 100 MG PO TABS
100.0000 mg | ORAL_TABLET | Freq: Two times a day (BID) | ORAL | Status: DC
  Administered 2017-12-29 – 2018-01-02 (×8): 100 mg via ORAL
  Filled 2017-12-29 (×8): qty 1

## 2017-12-29 NOTE — Progress Notes (Signed)
   Plastic Surgery  HD # 4 left thigh wound  Started Santyl dressing changes  Temp:  [98.5 F (36.9 C)-98.6 F (37 C)] 98.5 F (36.9 C) (09/28 0539) Pulse Rate:  [68-71] 68 (09/28 0539) Resp:  [16-19] 19 (09/28 0539) BP: (120-137)/(66-84) 129/66 (09/28 0539) SpO2:  [98 %-100 %] 98 % (09/28 0539)   PE:   Left thigh dressed- removed Xeroform, open wound medial extent laceration clean without significant drainage (the fluid present is the Santyl cream) Majority avulsed skin is viable, possible 3 x 2 cm area medially that will need to watch for debridement No cellulitis TTP is consistent with injury   A/P From Plastic Surgery prospective, does not need broad spectrum antibiotics. No fevers WBC normal since presentation and no cellulitis on clinical exam. Would switch to PO.  Wound care- ok to shower leg, soap and water ok pat dry. No soaking. LUE splint must remain dry intact. Apply Santyl twice daily and cover with dry dressing. (Do not need Xeroform in addition).   Patient states her roommate is unable to help her. Reviewed home health may be available for three times a week visits. If patient is unable to dressings changes twice a day then could discharge with xeroform and dry dressing alone to external wound daily. Patient has many concerns regarding return home, not sure her needs meet criteria for SNF.  Reviewed smoking will prolong wound issues and may lead to tissue loss.  F/u my clinic or [place] 7-10 d.  Irene Limbo, MD [facility]astic [facility] Re[facility]ive Surgery [phone], pin [phone]
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
## 2017-12-29 NOTE — Plan of Care (Signed)
°  Problem: Clinical Measurements: °Goal: Ability to maintain clinical measurements within normal limits will improve °Outcome: Progressing °  °Problem: Activity: °Goal: Risk for activity intolerance will decrease °Outcome: Progressing °  °Problem: Nutrition: °Goal: Adequate nutrition will be maintained °Outcome: Progressing °  °
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
## 2017-12-30 LAB — CBC WITH DIFFERENTIAL/PLATELET
Abs Immature Granulocytes: 0 10*3/uL (ref 0.0–0.1)
Basophils Absolute: 0.1 10*3/uL (ref 0.0–0.1)
Basophils Relative: 2 %
Eosinophils Absolute: 0.2 10*3/uL (ref 0.0–0.7)
Eosinophils Relative: 2 %
HCT: 40.4 % (ref 36.0–46.0)
Hemoglobin: 13.4 g/dL (ref 12.0–15.0)
Immature Granulocytes: 0 %
Lymphocytes Relative: 24 %
Lymphs Abs: 2.3 10*3/uL (ref 0.7–4.0)
MCH: 30.7 pg (ref 26.0–34.0)
MCHC: 33.2 g/dL (ref 30.0–36.0)
MCV: 92.4 fL (ref 78.0–100.0)
Monocytes Absolute: 1 10*3/uL (ref 0.1–1.0)
Monocytes Relative: 10 %
Neutro Abs: 5.8 10*3/uL (ref 1.7–7.7)
Neutrophils Relative %: 62 %
Platelets: 437 10*3/uL — ABNORMAL HIGH (ref 150–400)
RBC: 4.37 MIL/uL (ref 3.87–5.11)
RDW: 12.6 % (ref 11.5–15.5)
WBC: 9.3 10*3/uL (ref 4.0–10.5)

## 2017-12-30 LAB — BASIC METABOLIC PANEL
Anion gap: 7 (ref 5–15)
BUN: 10 mg/dL (ref 8–23)
CO2: 22 mmol/L (ref 22–32)
Calcium: 10.4 mg/dL — ABNORMAL HIGH (ref 8.9–10.3)
Chloride: 109 mmol/L (ref 98–111)
Creatinine, Ser: 0.76 mg/dL (ref 0.44–1.00)
GFR calc Af Amer: >60 mL/min (ref >60)
GFR calc non Af Amer: >60 mL/min (ref >60)
Glucose, Bld: 101 mg/dL — ABNORMAL HIGH (ref 70–99)
Potassium: 3.8 mmol/L (ref 3.5–5.1)
Sodium: 138 mmol/L (ref 135–145)

## 2017-12-30 NOTE — Plan of Care (Signed)
  Problem: Education: Goal: Knowledge of General Education information will improve Description: Including pain rating scale, medication(s)/side effects and non-pharmacologic comfort measures Outcome: Progressing   Problem: Activity: Goal: Risk for activity intolerance will decrease Outcome: Progressing   Problem: Nutrition: Goal: Adequate nutrition will be maintained Outcome: Progressing   
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
## 2017-12-31 LAB — CULTURE, BLOOD (ROUTINE X 2)
Culture: NO GROWTH
Culture: NO GROWTH
Special Requests: ADEQUATE
Special Requests: ADEQUATE

## 2017-12-31 NOTE — H&P (View-Only) (Signed)
   Plastic Surgery  HD # 6 left thigh wound  Awaiting SNF placement  Temp:  [97.8 F (36.6 C)-98.6 F (37 C)] 97.8 F (36.6 C) (09/30 0430) Pulse Rate:  [61-76] 76 (09/30 0430) Resp:  [18] 18 (09/30 0430) BP: (127-158)/(78-92) 127/92 (09/30 0430) SpO2:  [97 %-99 %] 99 % (09/30 0430)   PE:   Left thigh open wound medial extent laceration clean without significant drainage able to rpobe ti 1 cm depth, unchanged avulsed skin medially with progression of necrosis now appr 7 x 6 cm area ischemic and developing dry eschar. No cellulitis TTP is consistent with injury resolving hematoma   A/P Patient awaiting SNF placement. Will plan debridement in OR tomorrow. Reviewed with patient will have larger open wound, will require continued wound care, possible VAC.   Primary team notified. NPO p MN  Irene Limbo, MD [facility] [phone], pin [phone]

## 2017-12-31 NOTE — Plan of Care (Signed)

## 2017-12-31 NOTE — Social Work (Addendum)
CSW acknowledging SNF placement recommendations, unsure of pt's SNF coverage through [employer]. Have sent out referrals and will verify benefits.  4:15pm- CSW met with pt at bedside. Pt friend Manuela Schwartz joined via telephone. Discussed pt coverage under [employer] (pt has $420.92 remaining for out of pocket, when out of pocket reached SNF benefits are covered per benefits check). Pt friend requested SNF offers be sent to her email (pt gave verbal permission) [email or web address]. They will discuss offers and decide facility.    Alexander Mt, [place] Work [phone]
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
## 2017-12-31 NOTE — Progress Notes (Signed)
   Plastic Surgery  HD # 6 left thigh wound  Awaiting SNF placement  Temp:  [97.8 F (36.6 C)-98.6 F (37 C)] 97.8 F (36.6 C) (09/30 0430) Pulse Rate:  [61-76] 76 (09/30 0430) Resp:  [18] 18 (09/30 0430) BP: (127-158)/(78-92) 127/92 (09/30 0430) SpO2:  [97 %-99 %] 99 % (09/30 0430)   PE:   Left thigh open wound medial extent laceration clean without significant drainage able to rpobe ti 1 cm depth, unchanged avulsed skin medially with progression of necrosis now appr 7 x 6 cm area ischemic and developing dry eschar. No cellulitis TTP is consistent with injury resolving hematoma   A/P Patient awaiting SNF placement. Will plan debridement in OR tomorrow. Reviewed with patient will have larger open wound, will require continued wound care, possible VAC.   Primary team notified. NPO p MN  Irene Limbo, MD [facility] [phone], pin [phone]

## 2017-12-31 NOTE — NC FL2 (Signed)
[place] LEVEL OF CARE SCREENING TOOL     IDENTIFICATION  Patient Name: [name] [id number] Birthdate: 07/21/1955 Sex: female Admission Date (Current Location): 12/26/2017  [facility] and [place] Number:  Herbalist and Address:  [facility], [place] [address], [place], [place] [address]      Provider Number: [id number]  Attending Physician Name and Address:  Tawni Millers,*  Relative Name and Phone Number:  [name]; friend; [facility] Recommended Level of Care: [place] Prior Approval Number:    Date Approved/Denied:   PASRR Number: [id number]  Discharge Plan: SNF    Current Diagnoses: Patient Active Problem List   Diagnosis Date Noted  . Wound dehiscence   . Infected open wound 12/26/2017  . Left wrist fracture, sequela 12/26/2017  . Adrenal adenoma, left 09/03/2016  . CAD (coronary atherosclerotic disease) 09/03/2016  . Smoker 07/29/2016  . Moderate asthma 07/29/2016  . Anxiety 07/29/2016  . History of hysterectomy 07/29/2016  . Arthritis 07/29/2016  . COPD (chronic obstructive pulmonary disease) with emphysema ([place]) 07/29/2016    Orientation RESPIRATION BLADDER Height & Weight     Self, Time, Situation, Place  Normal Continent Weight: 148 lb 13 oz (67.5 kg) Height:  5\' 4"  (162.6 cm)  BEHAVIORAL SYMPTOMS/MOOD NEUROLOGICAL BOWEL NUTRITION STATUS      Continent Diet(see discharge summary)  AMBULATORY STATUS COMMUNICATION OF NEEDS Skin   Limited Assist Verbally Surgical wounds(incision on groin with abdominal pads 2x day; incision with staples left thigh 2x day; incision right knee adhesive strips)                       Personal Care Assistance Level of Assistance  Bathing, Feeding, Dressing Bathing Assistance: Limited assistance Feeding assistance: Independent Dressing Assistance: Limited assistance     Functional Limitations Info   Hearing, Speech, Sight Sight Info: Adequate Hearing Info: Adequate Speech Info: Adequate    SPECIAL CARE FACTORS FREQUENCY  PT (By licensed PT), OT (By licensed OT)     PT Frequency: 5x day OT Frequency: 5x day            Contractures Contractures Info: Not present    Additional Factors Info  Code Status, Allergies Code Status Info: Full Code Allergies Info: CEPHALEXIN, SULFA ANTIBIOTICS, HYDROCODONE, SULFA ANTIBIOTICS, HYDROCODONE-ACETAMINOPHEN            Current Medications (12/31/2017):  This is the current hospital active medication list Current Facility-Administered Medications  Medication Dose Route Frequency Provider Last Rate Last Dose  . acetaminophen (TYLENOL) tablet 650 mg  650 mg Oral Q6H PRN Opyd, Ilene Qua, MD       Or  . acetaminophen (TYLENOL) suppository 650 mg  650 mg Rectal Q6H PRN Opyd, Ilene Qua, MD      . ALPRAZolam Duanne Moron) tablet 1 mg  1 mg Oral TID PRN Arrien, Jimmy Picket, MD      . collagenase (SANTYL) ointment   Topical BID Eulogio Bear U, DO      . doxycycline (VIBRA-TABS) tablet 100 mg  100 mg Oral Q12H Arrien, Jimmy Picket, MD   100 mg at 12/30/17 2145  . HYDROmorphone (DILAUDID) injection 0.5 mg  0.5 mg Intravenous Q4H PRN Eulogio Bear U, DO   0.5 mg at 12/29/17 0023  . ipratropium-albuterol (DUONEB) 0.5-2.5 (3) MG/3ML nebulizer solution 3 mL  3 mL Inhalation QID PRN Opyd, Ilene Qua, MD      .  ondansetron (ZOFRAN) tablet 4 mg  4 mg Oral Q6H PRN Opyd, Ilene Qua, MD       Or  . ondansetron (ZOFRAN) injection 4 mg  4 mg Intravenous Q6H PRN Opyd, Ilene Qua, MD   4 mg at 12/28/17 1030  . oxyCODONE-acetaminophen (PERCOCET/ROXICET) 5-325 MG per tablet 1-2 tablet  1-2 tablet Oral Q6H PRN Eulogio Bear U, DO   2 tablet at 12/28/17 2057  . QUEtiapine (SEROQUEL) tablet 100 mg  100 mg Oral QHS PRN Opyd, Ilene Qua, MD   100 mg at 12/30/17 2145  . senna-docusate (Senokot-S) tablet 1 tablet  1 tablet Oral QHS PRN Opyd, Ilene Qua, MD          Discharge Medications: Please see discharge summary for a list of discharge medications.  Relevant Imaging Results:  Relevant Lab Results:   Additional Information SS#[id number] [place], [place]

## 2018-01-01 ENCOUNTER — Inpatient Hospital Stay (HOSPITAL_COMMUNITY): Payer: BLUE CROSS/BLUE SHIELD

## 2018-01-01 ENCOUNTER — Inpatient Hospital Stay (HOSPITAL_COMMUNITY): Payer: BLUE CROSS/BLUE SHIELD | Admitting: Anesthesiology

## 2018-01-01 ENCOUNTER — Encounter (HOSPITAL_COMMUNITY)
Admission: EM | Disposition: A | Discharge status: Skilled Nursing Facility | Payer: Self-pay | Source: Home / Self Care | Attending: Internal Medicine

## 2018-01-01 ENCOUNTER — Encounter (HOSPITAL_COMMUNITY): Payer: Self-pay | Admitting: Urology

## 2018-01-01 HISTORY — PX: INCISION AND DRAINAGE OF WOUND: SHX1803

## 2018-01-01 SURGERY — IRRIGATION AND DEBRIDEMENT WOUND
Anesthesia: General | Site: Thigh | Laterality: Left

## 2018-01-01 MED ORDER — OXYCODONE HCL 5 MG PO TABS
5.0000 mg | ORAL_TABLET | Freq: Once | ORAL | Status: AC | PRN
  Administered 2018-01-01: 5 mg via ORAL

## 2018-01-01 MED ORDER — OXYCODONE HCL 5 MG/5ML PO SOLN: 5.0000 mg | Freq: Once | ORAL | Status: AC | PRN

## 2018-01-01 MED ORDER — FENTANYL CITRATE (PF) 100 MCG/2ML IJ SOLN
25.0000 ug | INTRAMUSCULAR | Status: DC | PRN
  Administered 2018-01-01: 25 ug via INTRAVENOUS
  Administered 2018-01-01: 50 ug via INTRAVENOUS
  Administered 2018-01-01: 25 ug via INTRAVENOUS

## 2018-01-01 MED ORDER — DEXAMETHASONE SODIUM PHOSPHATE 10 MG/ML IJ SOLN
INTRAMUSCULAR | Status: AC
  Filled 2018-01-01: qty 1

## 2018-01-01 MED ORDER — OXYCODONE HCL 5 MG PO TABS
ORAL_TABLET | ORAL | Status: AC
  Filled 2018-01-01: qty 1

## 2018-01-01 MED ORDER — LACTATED RINGERS IV SOLN
INTRAVENOUS | Status: DC
  Administered 2018-01-01: 14:00:00 via INTRAVENOUS

## 2018-01-01 MED ORDER — PROPOFOL 10 MG/ML IV BOLUS
INTRAVENOUS | Status: DC | PRN
  Administered 2018-01-01: 150 mg via INTRAVENOUS

## 2018-01-01 MED ORDER — PHENYLEPHRINE HCL 10 MG/ML IJ SOLN
INTRAMUSCULAR | Status: DC | PRN
  Administered 2018-01-01: 80 ug via INTRAVENOUS

## 2018-01-01 MED ORDER — EPHEDRINE SULFATE 50 MG/ML IJ SOLN
INTRAMUSCULAR | Status: DC | PRN
  Administered 2018-01-01 (×2): 10 mg via INTRAVENOUS

## 2018-01-01 MED ORDER — CEFAZOLIN SODIUM-DEXTROSE 2-3 GM-%(50ML) IV SOLR
INTRAVENOUS | Status: DC | PRN
  Administered 2018-01-01: 2 g via INTRAVENOUS

## 2018-01-01 MED ORDER — LIDOCAINE HCL (CARDIAC) PF 100 MG/5ML IV SOSY
PREFILLED_SYRINGE | INTRAVENOUS | Status: DC | PRN
  Administered 2018-01-01: 60 mg via INTRAVENOUS

## 2018-01-01 MED ORDER — ONDANSETRON HCL 4 MG/2ML IJ SOLN
INTRAMUSCULAR | Status: DC | PRN
  Administered 2018-01-01: 4 mg via INTRAVENOUS

## 2018-01-01 MED ORDER — 0.9 % SODIUM CHLORIDE (POUR BTL) OPTIME
TOPICAL | Status: DC | PRN
  Administered 2018-01-01: 1000 mL

## 2018-01-01 MED ORDER — CEFAZOLIN SODIUM-DEXTROSE 2-4 GM/100ML-% IV SOLN
INTRAVENOUS | Status: AC
  Filled 2018-01-01: qty 100

## 2018-01-01 MED ORDER — ONDANSETRON HCL 4 MG/2ML IJ SOLN: 4.0000 mg | Freq: Once | INTRAMUSCULAR | Status: DC | PRN

## 2018-01-01 MED ORDER — EPHEDRINE 5 MG/ML INJ
INTRAVENOUS | Status: AC
  Filled 2018-01-01: qty 10

## 2018-01-01 MED ORDER — FENTANYL CITRATE (PF) 250 MCG/5ML IJ SOLN
INTRAMUSCULAR | Status: AC
  Filled 2018-01-01: qty 5

## 2018-01-01 MED ORDER — PHENYLEPHRINE 40 MCG/ML (10ML) SYRINGE FOR IV PUSH (FOR BLOOD PRESSURE SUPPORT)
PREFILLED_SYRINGE | INTRAVENOUS | Status: AC
  Filled 2018-01-01: qty 10

## 2018-01-01 MED ORDER — ONDANSETRON HCL 4 MG/2ML IJ SOLN
INTRAMUSCULAR | Status: AC
  Filled 2018-01-01: qty 2

## 2018-01-01 MED ORDER — FENTANYL CITRATE (PF) 250 MCG/5ML IJ SOLN
INTRAMUSCULAR | Status: DC | PRN
  Administered 2018-01-01: 25 ug via INTRAVENOUS
  Administered 2018-01-01 (×3): 50 ug via INTRAVENOUS
  Administered 2018-01-01: 25 ug via INTRAVENOUS
  Administered 2018-01-01: 50 ug via INTRAVENOUS

## 2018-01-01 MED ORDER — ALPRAZOLAM 0.25 MG PO TABS
ORAL_TABLET | ORAL | Status: AC
  Filled 2018-01-01: qty 2

## 2018-01-01 MED ORDER — MIDAZOLAM HCL 2 MG/2ML IJ SOLN
INTRAMUSCULAR | Status: AC
  Filled 2018-01-01: qty 2

## 2018-01-01 MED ORDER — BUPIVACAINE HCL (PF) 0.25 % IJ SOLN
INTRAMUSCULAR | Status: DC | PRN
  Administered 2018-01-01 (×2): 10 mL

## 2018-01-01 MED ORDER — LIDOCAINE 2% (20 MG/ML) 5 ML SYRINGE
INTRAMUSCULAR | Status: AC
  Filled 2018-01-01: qty 5

## 2018-01-01 MED ORDER — PROPOFOL 10 MG/ML IV BOLUS
INTRAVENOUS | Status: AC
  Filled 2018-01-01: qty 20

## 2018-01-01 MED ORDER — BUPIVACAINE HCL (PF) 0.25 % IJ SOLN
INTRAMUSCULAR | Status: AC
  Filled 2018-01-01: qty 30

## 2018-01-01 MED ORDER — MIDAZOLAM HCL 5 MG/5ML IJ SOLN
INTRAMUSCULAR | Status: DC | PRN
  Administered 2018-01-01: 2 mg via INTRAVENOUS

## 2018-01-01 MED ORDER — FENTANYL CITRATE (PF) 100 MCG/2ML IJ SOLN
INTRAMUSCULAR | Status: AC
  Filled 2018-01-01: qty 2

## 2018-01-01 MED ORDER — LACTATED RINGERS IV SOLN
INTRAVENOUS | Status: DC | PRN
  Administered 2018-01-01: 15:00:00 via INTRAVENOUS

## 2018-01-01 MED ORDER — DEXAMETHASONE SODIUM PHOSPHATE 10 MG/ML IJ SOLN
INTRAMUSCULAR | Status: DC | PRN
  Administered 2018-01-01: 10 mg via INTRAVENOUS

## 2018-01-01 SURGICAL SUPPLY — 48 items
BAG DECANTER FOR FLEXI CONT (MISCELLANEOUS) IMPLANT
BLADE CLIPPER SURG (BLADE) ×1 IMPLANT
BNDG GAUZE ELAST 4 BULKY (GAUZE/BANDAGES/DRESSINGS) IMPLANT
BNDG GAUZE STRTCH 6 (GAUZE/BANDAGES/DRESSINGS) ×1 IMPLANT
CANISTER SUCT 3000ML PPV (MISCELLANEOUS) ×2 IMPLANT
CHLORAPREP W/TINT 26ML (MISCELLANEOUS) IMPLANT
CONT SPEC 4OZ CLIKSEAL STRL BL (MISCELLANEOUS) IMPLANT
COVER SURGICAL LIGHT HANDLE (MISCELLANEOUS) ×2 IMPLANT
COVER WAND RF STERILE (DRAPES) ×2 IMPLANT
DRAPE HALF SHEET 40X57 (DRAPES) IMPLANT
DRAPE IMP U-DRAPE 54X76 (DRAPES) ×2 IMPLANT
DRAPE INCISE IOBAN 66X45 STRL (DRAPES) IMPLANT
DRAPE LAPAROTOMY T 98X78 PEDS (DRAPES) ×2 IMPLANT
DRSG ADAPTIC 3X8 NADH LF (GAUZE/BANDAGES/DRESSINGS) IMPLANT
DRSG PAD ABDOMINAL 8X10 ST (GAUZE/BANDAGES/DRESSINGS) ×3 IMPLANT
DRSG VAC ATS LRG SENSATRAC (GAUZE/BANDAGES/DRESSINGS) IMPLANT
DRSG VAC ATS MED SENSATRAC (GAUZE/BANDAGES/DRESSINGS) IMPLANT
DRSG VAC ATS SM SENSATRAC (GAUZE/BANDAGES/DRESSINGS) IMPLANT
ELECT CAUTERY BLADE 6.4 (BLADE) ×2 IMPLANT
ELECT COATED BLADE 2.86 ST (ELECTRODE) ×2 IMPLANT
ELECT REM PT RETURN 9FT ADLT (ELECTROSURGICAL) ×2
ELECTRODE REM PT RTRN 9FT ADLT (ELECTROSURGICAL) ×1 IMPLANT
GAUZE SPONGE 4X4 12PLY STRL (GAUZE/BANDAGES/DRESSINGS) ×2 IMPLANT
GLOVE BIO SURGEON STRL SZ 6 (GLOVE) ×2 IMPLANT
GLOVE SURG SS PI 6.0 STRL IVOR (GLOVE) ×2 IMPLANT
GOWN STRL REUS W/ TWL LRG LVL3 (GOWN DISPOSABLE) ×2 IMPLANT
GOWN STRL REUS W/TWL LRG LVL3 (GOWN DISPOSABLE) ×2
HANDPIECE INTERPULSE COAX TIP (DISPOSABLE)
KIT BASIN OR (CUSTOM PROCEDURE TRAY) ×2 IMPLANT
KIT TURNOVER KIT B (KITS) ×2 IMPLANT
NS IRRIG 1000ML POUR BTL (IV SOLUTION) ×2 IMPLANT
PACK GENERAL/GYN (CUSTOM PROCEDURE TRAY) ×2 IMPLANT
PACK UNIVERSAL I (CUSTOM PROCEDURE TRAY) ×1 IMPLANT
PAD ABD 8X10 STRL (GAUZE/BANDAGES/DRESSINGS) ×2 IMPLANT
PAD ARMBOARD 7.5X6 YLW CONV (MISCELLANEOUS) ×4 IMPLANT
SCRUB BETADINE 4OZ XXX (MISCELLANEOUS) ×1 IMPLANT
SET HNDPC FAN SPRY TIP SCT (DISPOSABLE) IMPLANT
SOLUTION BETADINE 4OZ (MISCELLANEOUS) ×1 IMPLANT
SURGILUBE 2OZ TUBE FLIPTOP (MISCELLANEOUS) IMPLANT
SUT MNCRL AB 3-0 PS2 18 (SUTURE) ×1 IMPLANT
SUT PDS AB 2-0 CT2 27 (SUTURE) ×1 IMPLANT
SUT VIC AB 5-0 PS2 18 (SUTURE) IMPLANT
SWAB COLLECTION DEVICE MRSA (MISCELLANEOUS) IMPLANT
SWAB CULTURE ESWAB REG 1ML (MISCELLANEOUS) IMPLANT
SYR CONTROL 10ML LL (SYRINGE) ×1 IMPLANT
TOWEL OR 17X24 6PK STRL BLUE (TOWEL DISPOSABLE) ×2 IMPLANT
TOWEL OR 17X26 10 PK STRL BLUE (TOWEL DISPOSABLE) ×2 IMPLANT
UNDERPAD 30X30 (UNDERPADS AND DIAPERS) ×2 IMPLANT

## 2018-01-01 NOTE — Progress Notes (Signed)
  Plastic Surgery  See Op Note for details.   Post debridement 15 x 7 cm open wound, majority of this is superficial with one area up to 2 cm depth distally. Drained seroma cavity.  Recommend moist to dry dressings for now. No plan for [facility] as inpatient. Stable for discharge or SNF transfer at this time.  F/u my clinic or wound center 7-10 d post discharge.  Irene Limbo, MD [facility] [phone], pin [phone]

## 2018-01-01 NOTE — Plan of Care (Signed)
  Problem: Pain Managment: Goal: General experience of comfort will improve Outcome: Progressing   Problem: Skin Integrity: Goal: Risk for impaired skin integrity will decrease Outcome: Progressing   

## 2018-01-01 NOTE — Social Work (Signed)
Authorization pending, CSW has informed SNF that pt will not require a wound vac at discharge.  Alexander Mt, [employer] Work [phone]

## 2018-01-01 NOTE — Transfer of Care (Signed)
Immediate Anesthesia Transfer of Care Note  Patient: [name] [id number]  Procedure(s) Performed: IRRIGATION AND DEBRIDEMENT LEFT THIGH WOUND (Left Thigh)  Patient Location: PACU  Anesthesia Type:General  Level of Consciousness: awake, alert  and oriented  Airway & Oxygen Therapy: Patient Spontanous Breathing  Post-op Assessment: Report given to RN and Post -op Vital signs reviewed and stable  Post vital signs: Reviewed and stable  Last Vitals:  Vitals Value Taken Time  BP 146/80 01/01/2018  3:25 PM  Temp    Pulse 84 01/01/2018  3:28 PM  Resp 16 01/01/2018  3:28 PM  SpO2 100 % 01/01/2018  3:28 PM  Vitals shown include unvalidated device data.  Last Pain:  Vitals:   01/01/18 0740  TempSrc:   PainSc: 0-No pain      Patients Stated Pain Goal: 2 (07/62/26 3335)  Complications: No apparent anesthesia complications

## 2018-01-01 NOTE — Social Work (Addendum)
CSW received email from pt friend Manuela Schwartz with the following information- "Let's see if  She can get into [employer] at [address] first then [employer] at [place] you"  CSW has contacted Tammy, liaison with [employer] to initiate authorization.  11:06am- received message from Amity, with [employer], [employer] portal is currently down, when available she will start authorization.   Alexander Mt, [place] Work [phone]

## 2018-01-01 NOTE — Anesthesia Procedure Notes (Signed)
Procedure Name: LMA Insertion Date/Time: 01/01/2018 3:01 PM Performed by: Carney Living, CRNA Pre-anesthesia Checklist: Emergency Drugs available, Suction available, Patient identified, Patient being monitored and Timeout performed Patient Re-evaluated:Patient Re-evaluated prior to induction Oxygen Delivery Method: Circle system utilized Preoxygenation: Pre-oxygenation with 100% oxygen Induction Type: IV induction LMA: LMA inserted LMA Size: 4.0 Number of attempts: 1 Placement Confirmation: positive ETCO2 Tube secured with: Tape Dental Injury: Teeth and Oropharynx as per pre-operative assessment

## 2018-01-01 NOTE — Anesthesia Preprocedure Evaluation (Signed)
Anesthesia Evaluation  Patient identified by MRN, date of birth, ID band Patient awake    Reviewed: Allergy & Precautions, NPO status , Patient's Chart, lab work & pertinent test results  History of Anesthesia Complications Negative for: history of anesthetic complications  Airway Mallampati: I  TM Distance: >3 FB Neck ROM: Full    Dental no notable dental hx.    Pulmonary asthma , COPD,  COPD inhaler, Current Smoker,     + decreased breath sounds(-) wheezing      Cardiovascular negative cardio ROS Normal cardiovascular exam Rhythm:Regular Rate:Normal     Neuro/Psych PSYCHIATRIC DISORDERS Anxiety negative neurological ROS     GI/Hepatic Neg liver ROS, GERD  Medicated,  Endo/Other  negative endocrine ROS  Renal/GU negative Renal ROS  negative genitourinary   Musculoskeletal  (+) Arthritis ,   Abdominal   Peds  Hematology negative hematology ROS (+)   Anesthesia Other Findings   Reproductive/Obstetrics negative OB ROS                            Anesthesia Physical Anesthesia Plan  ASA: III  Anesthesia Plan: General   Post-op Pain Management:    Induction: Intravenous  PONV Risk Score and Plan: 2 and Ondansetron, Dexamethasone and Treatment may vary due to age or medical condition  Airway Management Planned: LMA  Additional Equipment: None  Intra-op Plan:   Post-operative Plan: Extubation in OR  Informed Consent: I have reviewed the patients History and Physical, chart, labs and discussed the procedure including the risks, benefits and alternatives for the proposed anesthesia with the patient or authorized representative who has indicated his/her understanding and acceptance.     Plan Discussed with:   Anesthesia Plan Comments:        Anesthesia Quick Evaluation
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
## 2018-01-01 NOTE — Progress Notes (Signed)
Pt transfer to OR.

## 2018-01-01 NOTE — Interval H&P Note (Signed)
History and Physical Interval Note:  01/01/2018 1:56 PM  Breckin Zafar [id number]  has presented today for surgery, with the diagnosis of open wound left thigh  skin flap necrosis  The various methods of treatment have been discussed with the patient and family. After consideration of risks, benefits and other options for treatment, the patient has consented to  Procedure(s): IRRIGATION AND DEBRIDEMENT LEFT THIGH WOUND (Left) as a surgical intervention .  The patient's history has been reviewed, patient examined, no change in status, stable for surgery.  I have reviewed the patient's chart and labs.  Questions were answered to the patient's satisfaction.     Arnoldo Hooker Edyn Popoca

## 2018-01-01 NOTE — Progress Notes (Signed)
Pt came back from OR s/p wound debridement in the left thigh with dressing dry and intact, with SCD, left arm with ace wrapped, alert and oriented, no complain of pain at this time.

## 2018-01-01 NOTE — Care Management Note (Signed)
Case Management Note  Patient Details  Name: [name] MRN: [id number] Date of Birth: 1955-05-10  Subjective/Objective:                    Action/Plan:  Patient for debridement of thigh wound today and possible VAC placement. Home VAC application placed on shadow chart for MD signature in case needed. Expected Discharge Date:                  Expected Discharge Plan:  Skilled Nursing Facility  In-House Referral:  Clinical Social Work  Discharge planning Services     Post Acute Care Choice:  Durable Medical Equipment Choice offered to:     DME Arranged:    DME Agency:     HH Arranged:    [place] Agency:     Status of Service:  In process, will continue to follow  If discussed at Long Length of Stay Meetings, dates discussed:    Additional Comments:  Marilu Favre, RN 01/01/2018, 11:09 AM

## 2018-01-01 NOTE — Progress Notes (Signed)
Pt for left thigh wound debridement with consent, PCR negative, with peripheral IV line, NPO midnight, CHG done.
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
## 2018-01-02 ENCOUNTER — Encounter (HOSPITAL_COMMUNITY): Payer: Self-pay | Admitting: Plastic Surgery

## 2018-01-02 DIAGNOSIS — F17210 Nicotine dependence, cigarettes, uncomplicated: Secondary | ICD-10-CM

## 2018-01-02 DIAGNOSIS — T148XXA Other injury of unspecified body region, initial encounter: Secondary | ICD-10-CM

## 2018-01-02 DIAGNOSIS — L089 Local infection of the skin and subcutaneous tissue, unspecified: Secondary | ICD-10-CM

## 2018-01-02 LAB — BASIC METABOLIC PANEL
Anion gap: 8 (ref 5–15)
BUN: 13 mg/dL (ref 8–23)
CO2: 23 mmol/L (ref 22–32)
Calcium: 10.2 mg/dL (ref 8.9–10.3)
Chloride: 106 mmol/L (ref 98–111)
Creatinine, Ser: 0.73 mg/dL (ref 0.44–1.00)
GFR calc Af Amer: >60 mL/min (ref >60)
GFR calc non Af Amer: >60 mL/min (ref >60)
Glucose, Bld: 107 mg/dL — ABNORMAL HIGH (ref 70–99)
Potassium: 3.9 mmol/L (ref 3.5–5.1)
Sodium: 137 mmol/L (ref 135–145)

## 2018-01-02 LAB — CBC WITH DIFFERENTIAL/PLATELET
Abs Immature Granulocytes: 0.1 10*3/uL (ref 0.0–0.1)
Basophils Absolute: 0.1 10*3/uL (ref 0.0–0.1)
Basophils Relative: 1 %
Eosinophils Absolute: 0 10*3/uL (ref 0.0–0.7)
Eosinophils Relative: 0 %
HCT: 34.2 % — ABNORMAL LOW (ref 36.0–46.0)
Hemoglobin: 11.2 g/dL — ABNORMAL LOW (ref 12.0–15.0)
Immature Granulocytes: 0 %
Lymphocytes Relative: 14 %
Lymphs Abs: 1.7 10*3/uL (ref 0.7–4.0)
MCH: 30.9 pg (ref 26.0–34.0)
MCHC: 32.7 g/dL (ref 30.0–36.0)
MCV: 94.5 fL (ref 78.0–100.0)
Monocytes Absolute: 0.9 10*3/uL (ref 0.1–1.0)
Monocytes Relative: 7 %
Neutro Abs: 9.7 10*3/uL — ABNORMAL HIGH (ref 1.7–7.7)
Neutrophils Relative %: 78 %
Platelets: 343 10*3/uL (ref 150–400)
RBC: 3.62 MIL/uL — ABNORMAL LOW (ref 3.87–5.11)
RDW: 12.9 % (ref 11.5–15.5)
WBC: 12.5 10*3/uL — ABNORMAL HIGH (ref 4.0–10.5)

## 2018-01-02 MED ORDER — CLONAZEPAM 0.5 MG PO TABS: 0.2500 mg | ORAL_TABLET | Freq: Every day | ORAL | 0 refills | Status: DC | PRN

## 2018-01-02 MED ORDER — OXYCODONE-ACETAMINOPHEN 5-325 MG PO TABS: 1.0000 | ORAL_TABLET | Freq: Three times a day (TID) | ORAL | 0 refills | Status: AC | PRN

## 2018-01-02 MED ORDER — SENNOSIDES-DOCUSATE SODIUM 8.6-50 MG PO TABS: 1.0000 | ORAL_TABLET | Freq: Every day | ORAL | 0 refills | Status: DC

## 2018-01-02 MED ORDER — WHITE PETROLATUM EX OINT
TOPICAL_OINTMENT | CUTANEOUS | Status: AC
  Administered 2018-01-02: 16:00:00
  Filled 2018-01-02: qty 28.35

## 2018-01-02 NOTE — Progress Notes (Signed)
PTAR picked pt up at this time, pt d/c'd with her belongings.

## 2018-01-02 NOTE — Progress Notes (Signed)
Report given to nurse Marium at [place], [place].
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
## 2018-01-02 NOTE — Clinical Social Work Placement (Signed)
   CLINICAL SOCIAL WORK PLACEMENT  NOTE [facility] RN to call report [phone]  Date:  01/02/2018  Patient Details  Name: [name] MRN: [id number] Date of Birth: 09-Feb-1956  Clinical Social Work is seeking post-discharge placement for this patient at the [place] level of care (*CSW will initial, date and re-position this form in  chart as items are completed):  Yes   Patient/family provided with [place] Work Department's list of facilities offering this level of care within the geographic area requested by the patient (or if unable, by the patient's family).  Yes   Patient/family informed of their freedom to choose among providers that offer the needed level of care, that participate in Medicare, Medicaid or managed care program needed by the patient, have an available bed and are willing to accept the patient.  Yes   Patient/family informed of [facility]'s ownership interest in [facility] and [facility], as well as of the fact that they are under no obligation to receive care at these facilities.  PASRR submitted to EDS on       PASRR number received on 12/31/17     Existing PASRR number confirmed on       FL2 transmitted to all facilities in geographic area requested by pt/family on 12/31/17     FL2 transmitted to all facilities within larger geographic area on       Patient informed that his/her managed care company has contracts with or will negotiate with certain facilities, including the following:        Yes   Patient/family informed of bed offers received.  Patient chooses bed at Other - please specify in the comment section below:([facility])     Physician recommends and patient chooses bed at      Patient to be transferred to Other - please specify in the comment section below:([facility]) on 01/02/18.  Patient to be transferred to facility by [facility]     Patient family  notified on 01/02/18 of transfer.  Name of family member notified:  pt friend Manuela Schwartz     PHYSICIAN       Additional Comment:    _______________________________________________ Alexander Mt, [place] 01/02/2018, 1:32 PM
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
## 2018-01-02 NOTE — Progress Notes (Signed)
  Plastic Surgery  POD#1 debridement left thigh No significant pain, states called Dr. Fredna Dow yesterday and had CT last night wrist  BP 135/73 (BP Location: Right Arm)   Pulse 93   Temp 98.3 F (36.8 C) (Oral)   Resp 16   Ht 5\' 4"  (1.626 m)   Wt 67.5 kg   SpO2 100%   BMI 25.54 kg/m   PE Left thigh dressing removed, serosanguinous drainage wound clean with exposed fat, one area of depth to 2 cm  A/P Wound clean, recommend bid moist to dry dressing for now. If drainage manageable can switch to silver alginate in few days.  Ok to continue to remove dressing and shower, thigh wound may get wet soap and water ok. Pat dry. Keep LUE splint dry.  Stable for discharge from plastics perspective. Discussed with patient can consider skin graft once wound granulated vs heal secondarily.   She has completed a week of antibiotics, I am ok with stopping all antibiotics at this time.  Irene Limbo, MD [facility] [phone], pin [phone]

## 2018-01-02 NOTE — Social Work (Signed)
Clinical Social Worker facilitated patient discharge including contacting patient family and facility to confirm patient discharge plans.  Clinical information faxed to facility and family agreeable with plan.  CSW arranged ambulance transport via [facility] to [place] room 146. [facility] called at 1:27pm for 3:00pm pick up. RN to call [phone] with report prior to discharge.  Clinical Social Worker will sign off for now as social work intervention is no longer needed. Please consult [place] again if new need arises.  Alexander Mt, [place] Social Worker

## 2018-01-02 NOTE — Progress Notes (Signed)
Discharge instructions reviewed with pt. Copy of instructions given to pt. Scripts in packet for SNF.   Pt waiting for [facility] to pick her up for transport.

## 2018-01-02 NOTE — Social Work (Addendum)
CSW confirmed authorization for pt to discharge to [facility].   Alexander Mt, [place] Work [phone]

## 2018-01-03 NOTE — Anesthesia Postprocedure Evaluation (Signed)
Anesthesia Post Note  Patient: Marche Hottenstein [id number]  Procedure(s) Performed: IRRIGATION AND DEBRIDEMENT LEFT THIGH WOUND (Left Thigh)     Patient location during evaluation: PACU Anesthesia Type: General Level of consciousness: awake and alert Pain management: pain level controlled Vital Signs Assessment: post-procedure vital signs reviewed and stable Respiratory status: spontaneous breathing, nonlabored ventilation, respiratory function stable and patient connected to nasal cannula oxygen Cardiovascular status: blood pressure returned to baseline and stable Postop Assessment: no apparent nausea or vomiting Anesthetic complications: no    Last Vitals:  Vitals:   01/01/18 2116 01/02/18 0508  BP: 135/73 108/62  Pulse: 93 78  Resp: 16 16  Temp: 36.8 C 36.6 C  SpO2: 100% 97%    Last Pain:  Vitals:   01/02/18 1500  TempSrc:   PainSc: 3                  Lidia Collum

## 2018-01-04 ENCOUNTER — Ambulatory Visit: Payer: Self-pay | Admitting: Psychiatry

## 2018-01-10 ENCOUNTER — Other Ambulatory Visit: Payer: Self-pay

## 2018-01-11 ENCOUNTER — Other Ambulatory Visit: Payer: Self-pay | Admitting: Psychiatry

## 2018-01-11 MED ORDER — AMPHETAMINE-DEXTROAMPHETAMINE 20 MG PO TABS: 20.0000 mg | ORAL_TABLET | Freq: Three times a day (TID) | ORAL | 0 refills | Status: DC

## 2018-01-11 NOTE — Telephone Encounter (Signed)
RX sent

## 2018-01-30 ENCOUNTER — Ambulatory Visit (INDEPENDENT_AMBULATORY_CARE_PROVIDER_SITE_OTHER): Payer: BLUE CROSS/BLUE SHIELD | Admitting: Psychiatry

## 2018-01-30 ENCOUNTER — Encounter: Payer: Self-pay | Admitting: Psychiatry

## 2018-01-30 DIAGNOSIS — F902 Attention-deficit hyperactivity disorder, combined type: Secondary | ICD-10-CM | POA: Diagnosis not present

## 2018-01-30 DIAGNOSIS — F411 Generalized anxiety disorder: Secondary | ICD-10-CM

## 2018-01-30 DIAGNOSIS — G4726 Circadian rhythm sleep disorder, shift work type: Secondary | ICD-10-CM | POA: Diagnosis not present

## 2018-01-30 DIAGNOSIS — F3181 Bipolar II disorder: Secondary | ICD-10-CM | POA: Diagnosis not present

## 2018-01-30 DIAGNOSIS — G3184 Mild cognitive impairment, so stated: Secondary | ICD-10-CM

## 2018-01-30 MED ORDER — LITHIUM CARBONATE ER 450 MG PO TBCR: 450.0000 mg | EXTENDED_RELEASE_TABLET | Freq: Every day | ORAL | 1 refills | Status: DC

## 2018-01-30 MED ORDER — AMPHETAMINE-DEXTROAMPHETAMINE 20 MG PO TABS: 20.0000 mg | ORAL_TABLET | Freq: Three times a day (TID) | ORAL | 0 refills | Status: DC

## 2018-01-30 MED ORDER — QUETIAPINE FUMARATE ER 300 MG PO TB24: ORAL_TABLET | ORAL | 1 refills | Status: DC
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
## 2018-02-06 ENCOUNTER — Other Ambulatory Visit: Payer: Self-pay | Admitting: Psychiatry

## 2018-02-06 DIAGNOSIS — F902 Attention-deficit hyperactivity disorder, combined type: Secondary | ICD-10-CM

## 2018-02-06 MED ORDER — AMPHETAMINE-DEXTROAMPHETAMINE 20 MG PO TABS: 20.0000 mg | ORAL_TABLET | Freq: Three times a day (TID) | ORAL | 0 refills | Status: DC

## 2018-02-06 NOTE — Progress Notes (Signed)
Pt wanted adderall sent to different pharmacy

## 2018-02-21 ENCOUNTER — Other Ambulatory Visit: Payer: Self-pay | Admitting: Psychiatry

## 2018-03-01 ENCOUNTER — Encounter: Payer: Self-pay | Admitting: Emergency Medicine

## 2018-03-01 DIAGNOSIS — F988 Other specified behavioral and emotional disorders with onset usually occurring in childhood and adolescence: Secondary | ICD-10-CM

## 2018-03-01 DIAGNOSIS — F411 Generalized anxiety disorder: Secondary | ICD-10-CM | POA: Insufficient documentation

## 2018-03-14 ENCOUNTER — Encounter: Payer: Self-pay | Admitting: Psychiatry

## 2018-03-14 ENCOUNTER — Ambulatory Visit (INDEPENDENT_AMBULATORY_CARE_PROVIDER_SITE_OTHER): Payer: BLUE CROSS/BLUE SHIELD | Admitting: Psychiatry

## 2018-03-14 DIAGNOSIS — F902 Attention-deficit hyperactivity disorder, combined type: Secondary | ICD-10-CM

## 2018-03-14 DIAGNOSIS — F3162 Bipolar disorder, current episode mixed, moderate: Secondary | ICD-10-CM

## 2018-03-14 DIAGNOSIS — F411 Generalized anxiety disorder: Secondary | ICD-10-CM

## 2018-03-14 MED ORDER — QUETIAPINE FUMARATE ER 300 MG PO TB24: ORAL_TABLET | ORAL | 0 refills | Status: DC

## 2018-03-14 MED ORDER — AMPHETAMINE-DEXTROAMPHETAMINE 20 MG PO TABS: 20.0000 mg | ORAL_TABLET | Freq: Three times a day (TID) | ORAL | 0 refills | Status: DC
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
## 2018-03-14 NOTE — Patient Instructions (Signed)
Increase the Seroquel XR to 2 tablets 2 or 3 hours before bedtime

## 2018-03-22 ENCOUNTER — Other Ambulatory Visit: Payer: Self-pay

## 2018-03-22 ENCOUNTER — Telehealth: Payer: Self-pay | Admitting: Psychiatry

## 2018-03-22 MED ORDER — ALPRAZOLAM 0.5 MG PO TABS: ORAL_TABLET | ORAL | 1 refills | Status: DC

## 2018-03-22 NOTE — Telephone Encounter (Signed)
Rx called in Pt informed

## 2018-03-22 NOTE — Telephone Encounter (Signed)
Patient requesting refill on Xanax 0.5mg . [facility] [address]. Call when completed please.

## 2018-05-01 ENCOUNTER — Ambulatory Visit: Payer: BLUE CROSS/BLUE SHIELD | Admitting: Psychiatry

## 2018-05-13 ENCOUNTER — Other Ambulatory Visit: Payer: Self-pay | Admitting: Psychiatry

## 2018-05-13 ENCOUNTER — Telehealth: Payer: Self-pay | Admitting: Psychiatry

## 2018-05-13 DIAGNOSIS — F902 Attention-deficit hyperactivity disorder, combined type: Secondary | ICD-10-CM

## 2018-05-13 MED ORDER — AMPHETAMINE-DEXTROAMPHETAMINE 20 MG PO TABS: 20.0000 mg | ORAL_TABLET | Freq: Three times a day (TID) | ORAL | 0 refills | Status: DC

## 2018-05-13 NOTE — Telephone Encounter (Signed)
Patient called and said that she needsa refill of 20mg  adderall 3x daily. She has the blue local insurance plan and is trying to find a doctor in [place]. [facility] [place]

## 2018-05-13 NOTE — Progress Notes (Signed)
Adderall refill done.  Patient has new insurance which requires her to go to the [facility] doctor.  She is trying to find a new doctor.

## 2018-05-17 ENCOUNTER — Other Ambulatory Visit: Payer: Self-pay | Admitting: Psychiatry

## 2018-05-17 NOTE — Telephone Encounter (Signed)
[name] called to request refill of her Xanax.  Her insurance is [facility] she it won't cover visits here.  She is trying to locate another psychiatrist but there doesn't seem to be one local in [place].  Will you please prescribe while she looks for a new dr.?  [facility] [place]

## 2018-06-06 ENCOUNTER — Telehealth: Payer: Self-pay | Admitting: Psychiatry

## 2018-06-06 ENCOUNTER — Other Ambulatory Visit: Payer: Self-pay | Admitting: Psychiatry

## 2018-06-06 DIAGNOSIS — F902 Attention-deficit hyperactivity disorder, combined type: Secondary | ICD-10-CM

## 2018-06-06 MED ORDER — QUETIAPINE FUMARATE 100 MG PO TABS: 100.0000 mg | ORAL_TABLET | Freq: Every evening | ORAL | 1 refills | Status: DC | PRN

## 2018-06-06 MED ORDER — AMPHETAMINE-DEXTROAMPHETAMINE 20 MG PO TABS: 20.0000 mg | ORAL_TABLET | Freq: Three times a day (TID) | ORAL | 0 refills | Status: DC

## 2018-06-06 NOTE — Telephone Encounter (Signed)
Patient had to leave practice due to Insurance has not found another provider patient stopped taking Seroquil 300 mg. Extended release and the Lithium, patient does not have a PCP as of right now.   Refill for Adderall is due on 03/12 [facility] [place], Xanax and 100 mg. [facility] [address]. Patient can be reached at [phone]

## 2018-06-06 NOTE — Telephone Encounter (Signed)
She will have to find another doctor to take over prescribing this within the next 8  weeks.  I will have to stop prescribing the medication by then.  I think it is unwise that she stopped the Seroquel extended release and lithium because I believe she has bipolar disorder and now she is not on a sufficient amount of mood stabilizer.  This is very risky.  I will not continue to prescribe the other medications beyond 8 weeks. Please inform patient she must find another provider within that length of time.  I will send in the prescriptions she requested.

## 2018-06-07 NOTE — Telephone Encounter (Signed)
Pt aware and has already checked into getting a new provider. Looks like 1 refill left on xanax for this month but may need one for April if not in with provider. Instructed to call back with any concerns.

## 2018-06-11 ENCOUNTER — Other Ambulatory Visit: Payer: Self-pay | Admitting: Psychiatry

## 2018-06-25 ENCOUNTER — Telehealth: Payer: Self-pay | Admitting: Psychiatry

## 2018-06-25 NOTE — Telephone Encounter (Signed)
Pt has new insurance and we are not in network. She has an appointment set for March 31st at new Dr, but want see her new Psy. Dr until May. She wants to know if she can get a refill for Seroquel 100mg , Xanax, Adderall.  [facility] [address] [facility] [address]

## 2018-06-25 NOTE — Telephone Encounter (Signed)
Pt's last fill Alprazolam #30 on 03/12 Last refill Adderall #90 on 03/07 Has refill for seroquel on file for both strengths

## 2018-06-30 ENCOUNTER — Other Ambulatory Visit: Payer: Self-pay | Admitting: Psychiatry

## 2018-07-04 ENCOUNTER — Other Ambulatory Visit: Payer: Self-pay

## 2018-07-04 DIAGNOSIS — F902 Attention-deficit hyperactivity disorder, combined type: Secondary | ICD-10-CM

## 2018-07-04 MED ORDER — AMPHETAMINE-DEXTROAMPHETAMINE 20 MG PO TABS: 20.0000 mg | ORAL_TABLET | Freq: Three times a day (TID) | ORAL | 0 refills | Status: DC

## 2018-07-04 NOTE — Telephone Encounter (Signed)
Submitted adderall for approval. Hold on Alprazolam till next week.

## 2018-07-10 ENCOUNTER — Other Ambulatory Visit: Payer: Self-pay

## 2018-07-10 MED ORDER — ALPRAZOLAM 0.5 MG PO TABS: ORAL_TABLET | ORAL | 1 refills | Status: DC

## 2018-07-26 ENCOUNTER — Telehealth: Payer: Self-pay | Admitting: Psychiatry

## 2018-07-26 ENCOUNTER — Other Ambulatory Visit: Payer: Self-pay | Admitting: Psychiatry

## 2018-07-26 NOTE — Telephone Encounter (Signed)
Pt has cold sores on mouth which she thinks are coming from stress and are very painful.she wants to know if you can call he in something until she see her new provider in June. Had xanax but she switched to 2/day and she only has 2 left.

## 2018-07-26 NOTE — Telephone Encounter (Signed)
She cannot take more than 1 Xanax daily.  It is a controlled substance she cannot exceed the dosage that is prescribed.  She will run out early and she will have to do without until the next refill is due.  I do not prescribe medication for cold sores.  I am not an expert on the subject but I have heard that lysine available over-the-counter is helpful.  She might try that

## 2018-07-29 NOTE — Telephone Encounter (Signed)
Called and made pt. Aware. She verbalized understanding.

## 2018-08-01 ENCOUNTER — Other Ambulatory Visit: Payer: Self-pay

## 2018-08-01 ENCOUNTER — Telehealth: Payer: Self-pay | Admitting: Psychiatry

## 2018-08-01 DIAGNOSIS — F902 Attention-deficit hyperactivity disorder, combined type: Secondary | ICD-10-CM

## 2018-08-01 NOTE — Telephone Encounter (Signed)
Patient need refill on Adderall to be sent to [facility] on [address] in [place], patient has appt., with New Psychiatrist on June 12th and her name is [name] in [employer] [phone]

## 2018-08-01 NOTE — Telephone Encounter (Signed)
Submitted last fill 04/05

## 2018-08-03 MED ORDER — AMPHETAMINE-DEXTROAMPHETAMINE 20 MG PO TABS: 20.0000 mg | ORAL_TABLET | Freq: Three times a day (TID) | ORAL | 0 refills | Status: DC

## 2018-08-05 ENCOUNTER — Telehealth: Payer: Self-pay | Admitting: Psychiatry

## 2018-08-05 NOTE — Telephone Encounter (Signed)
Pt left message. Need refill on Adderall @ [facility]  [address] [place]

## 2018-08-05 NOTE — Telephone Encounter (Signed)
Submitted 05/02 with no early refills, needs to call pharmacy

## 2018-09-02 ENCOUNTER — Telehealth: Payer: Self-pay | Admitting: Psychiatry

## 2018-09-02 ENCOUNTER — Other Ambulatory Visit: Payer: Self-pay

## 2018-09-02 DIAGNOSIS — F902 Attention-deficit hyperactivity disorder, combined type: Secondary | ICD-10-CM

## 2018-09-02 MED ORDER — AMPHETAMINE-DEXTROAMPHETAMINE 20 MG PO TABS: 20.0000 mg | ORAL_TABLET | Freq: Three times a day (TID) | ORAL | 0 refills | Status: DC

## 2018-09-02 NOTE — Telephone Encounter (Signed)
Last rx for Adderall pended for approval

## 2018-09-02 NOTE — Telephone Encounter (Signed)
See his new psychiatrist September 13, 2018.  This should be the last refill until that appointment.

## 2018-09-02 NOTE — Telephone Encounter (Signed)
Pt is out of network and is seeing a new provider. Stated Adderall is due today and appt with new Psychiatrist is not until 6/12. Please fill at the [facility] on [address]. Stated CC said he would do this for her one last time.

## 2018-09-06 ENCOUNTER — Telehealth: Payer: Self-pay | Admitting: Psychiatry

## 2018-09-06 ENCOUNTER — Other Ambulatory Visit: Payer: Self-pay | Admitting: Psychiatry

## 2018-09-06 NOTE — Telephone Encounter (Signed)
Patient called and said that she needs a refill on her xanax. She sees her new psychiatrist on 6/12. [facility] on [address]

## 2018-09-06 NOTE — Telephone Encounter (Signed)
Called into pharmacy 1 month only

## 2018-09-24 ENCOUNTER — Other Ambulatory Visit: Payer: Self-pay | Admitting: Psychiatry

## 2018-10-04 ENCOUNTER — Other Ambulatory Visit: Payer: Self-pay | Admitting: Psychiatry

## 2019-02-13 ENCOUNTER — Emergency Department (HOSPITAL_BASED_OUTPATIENT_CLINIC_OR_DEPARTMENT_OTHER)
Admission: EM | Admit: 2019-02-13 | Discharge: 2019-02-13 | Disposition: A | Discharge status: Home / Self Care | Payer: BLUE CROSS/BLUE SHIELD | Attending: Emergency Medicine | Admitting: Emergency Medicine

## 2019-02-13 ENCOUNTER — Emergency Department (HOSPITAL_BASED_OUTPATIENT_CLINIC_OR_DEPARTMENT_OTHER): Payer: BLUE CROSS/BLUE SHIELD

## 2019-02-13 ENCOUNTER — Other Ambulatory Visit: Payer: Self-pay

## 2019-02-13 ENCOUNTER — Encounter (HOSPITAL_BASED_OUTPATIENT_CLINIC_OR_DEPARTMENT_OTHER): Payer: Self-pay | Admitting: *Deleted

## 2019-02-13 DIAGNOSIS — F17210 Nicotine dependence, cigarettes, uncomplicated: Secondary | ICD-10-CM | POA: Diagnosis not present

## 2019-02-13 DIAGNOSIS — Z885 Allergy status to narcotic agent status: Secondary | ICD-10-CM | POA: Insufficient documentation

## 2019-02-13 DIAGNOSIS — M13161 Monoarthritis, not elsewhere classified, right knee: Secondary | ICD-10-CM | POA: Insufficient documentation

## 2019-02-13 DIAGNOSIS — M25461 Effusion, right knee: Secondary | ICD-10-CM | POA: Insufficient documentation

## 2019-02-13 DIAGNOSIS — Z881 Allergy status to other antibiotic agents status: Secondary | ICD-10-CM | POA: Diagnosis not present

## 2019-02-13 DIAGNOSIS — J449 Chronic obstructive pulmonary disease, unspecified: Secondary | ICD-10-CM | POA: Diagnosis not present

## 2019-02-13 DIAGNOSIS — Z882 Allergy status to sulfonamides status: Secondary | ICD-10-CM | POA: Diagnosis not present

## 2019-02-13 DIAGNOSIS — M1711 Unilateral primary osteoarthritis, right knee: Secondary | ICD-10-CM

## 2019-02-13 DIAGNOSIS — M25561 Pain in right knee: Secondary | ICD-10-CM | POA: Diagnosis present

## 2019-02-13 DIAGNOSIS — Z79899 Other long term (current) drug therapy: Secondary | ICD-10-CM | POA: Diagnosis not present

## 2019-02-13 DIAGNOSIS — I2510 Atherosclerotic heart disease of native coronary artery without angina pectoris: Secondary | ICD-10-CM | POA: Diagnosis not present

## 2019-02-13 MED ORDER — OXYCODONE-ACETAMINOPHEN 5-325 MG PO TABS: 1.0000 | ORAL_TABLET | Freq: Four times a day (QID) | ORAL | 0 refills | Status: DC | PRN

## 2019-02-13 MED ORDER — PREDNISONE 20 MG PO TABS: 40.0000 mg | ORAL_TABLET | Freq: Every day | ORAL | 0 refills | Status: DC

## 2019-02-13 MED ORDER — OXYCODONE-ACETAMINOPHEN 5-325 MG PO TABS
1.0000 | ORAL_TABLET | Freq: Once | ORAL | Status: AC
  Administered 2019-02-13: 1 via ORAL
  Filled 2019-02-13: qty 1

## 2019-02-13 MED FILL — predniSONE 20 MG TABS: 20 | 5 days supply | Qty: 10 | Fill #0

## 2019-02-13 MED FILL — OXYCODONE-ACETAMINOPHEN 5-3: 5-325 | 3 days supply | Qty: 10 | Fill #0

## 2019-02-13 NOTE — ED Notes (Signed)
ED Provider at bedside. 

## 2019-02-13 NOTE — Discharge Instructions (Signed)
Wear the knee immobilizer you have for pain control and ice and elevate as often as you can.
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
## 2019-02-19 ENCOUNTER — Ambulatory Visit (INDEPENDENT_AMBULATORY_CARE_PROVIDER_SITE_OTHER): Payer: BLUE CROSS/BLUE SHIELD | Admitting: Family Medicine

## 2019-02-19 ENCOUNTER — Encounter: Payer: Self-pay | Admitting: Family Medicine

## 2019-02-19 ENCOUNTER — Ambulatory Visit: Payer: Self-pay

## 2019-02-19 ENCOUNTER — Other Ambulatory Visit: Payer: Self-pay

## 2019-02-19 VITALS — BP 114/78 | Ht 66.0 in | Wt 140.0 lb

## 2019-02-19 DIAGNOSIS — M25561 Pain in right knee: Secondary | ICD-10-CM

## 2019-02-19 DIAGNOSIS — M23203 Derangement of unspecified medial meniscus due to old tear or injury, right knee: Secondary | ICD-10-CM | POA: Insufficient documentation

## 2019-02-19 DIAGNOSIS — G8929 Other chronic pain: Secondary | ICD-10-CM

## 2019-02-19 MED ORDER — TRIAMCINOLONE ACETONIDE 40 MG/ML IJ SUSP
40.0000 mg | Freq: Once | INTRAMUSCULAR | Status: AC
  Administered 2019-02-19: 15:00:00 40 mg via INTRA_ARTICULAR

## 2019-02-19 NOTE — Patient Instructions (Signed)
Nice to meet you Please try ice  Please try compression   Please send me a message in MyChart with any questions or updates.  Please see me back in 4 weeks.   --Dr. Raeford Razor

## 2019-02-19 NOTE — Assessment & Plan Note (Signed)
Pain occurring over the medial joint line as well as a large Baker's cyst.  Likely related degenerative changes. -Aspiration of the Baker's cyst and injection. -Counseled on compression. -Counseled on home exercise therapy and supportive care. -Could consider gel injections or physical therapy.
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
## 2019-03-05 ENCOUNTER — Telehealth: Payer: Self-pay | Admitting: Family Medicine

## 2019-03-05 MED ORDER — IBUPROFEN 600 MG PO TABS: 600.0000 mg | ORAL_TABLET | Freq: Three times a day (TID) | ORAL | 0 refills | Status: DC | PRN

## 2019-03-05 NOTE — Telephone Encounter (Signed)
Patient called requesting medication for the pain in her knee. She states she is in excruciating pain. She is unable to walk and unable to sleep.   Preferred Pharmacy: [facility], [place],  [id number]

## 2019-03-05 NOTE — Telephone Encounter (Signed)
Spoke with patient. Can try ibuprofen. Could consider gel injections in the future going forward.   Rosemarie Ax, MD [facility] 03/05/2019, 10:06 AM

## 2019-04-11 ENCOUNTER — Other Ambulatory Visit: Payer: Self-pay | Admitting: Psychiatry

## 2019-04-29 ENCOUNTER — Encounter: Payer: Self-pay | Admitting: Psychiatry

## 2019-04-29 ENCOUNTER — Ambulatory Visit (INDEPENDENT_AMBULATORY_CARE_PROVIDER_SITE_OTHER): Payer: 59 | Admitting: Psychiatry

## 2019-04-29 DIAGNOSIS — F902 Attention-deficit hyperactivity disorder, combined type: Secondary | ICD-10-CM | POA: Diagnosis not present

## 2019-04-29 DIAGNOSIS — F411 Generalized anxiety disorder: Secondary | ICD-10-CM | POA: Diagnosis not present

## 2019-04-29 DIAGNOSIS — F3162 Bipolar disorder, current episode mixed, moderate: Secondary | ICD-10-CM

## 2019-04-29 DIAGNOSIS — F5105 Insomnia due to other mental disorder: Secondary | ICD-10-CM | POA: Diagnosis not present

## 2019-04-29 MED ORDER — ALPRAZOLAM 0.5 MG PO TABS: 0.5000 mg | ORAL_TABLET | Freq: Every evening | ORAL | 5 refills | Status: DC | PRN

## 2019-04-29 MED ORDER — AMPHETAMINE-DEXTROAMPHETAMINE 20 MG PO TABS: 20.0000 mg | ORAL_TABLET | Freq: Three times a day (TID) | ORAL | 0 refills | Status: DC

## 2019-04-29 MED ORDER — QUETIAPINE FUMARATE ER 300 MG PO TB24: 600.0000 mg | ORAL_TABLET | Freq: Every day | ORAL | 1 refills | Status: DC

## 2019-04-29 MED ORDER — QUETIAPINE FUMARATE 100 MG PO TABS: 100.0000 mg | ORAL_TABLET | Freq: Every evening | ORAL | 1 refills | Status: DC | PRN

## 2019-04-29 NOTE — Progress Notes (Signed)
[name]:[id number] [birth year] 64 y.o.  Subjective:   Patient ID:  [name] is a 64 y.o. (DOB 03/09/1956) female.  Chief Complaint:  Chief Complaint  Patient presents with  . Follow-up    Medication Management  . Anxiety    Medication Management  . ADD    Medication Management    HPI [name] [id number] presents to the office today for follow-up of bipolar disorder and ADD and anxiety and sleep.  Doing better this year.  Coming here again bc closer for her and her only transportation is scootter.  Seroquel XR as mood  Med and IR 100 mg HS for sleep.  Overall mood stability good with occ anger outbursts. Still on Adderall and needs it as before and benefits from it.  Pleased with meds.  Patient reports stable mood and denies depressed or irritable moods.  Patient denies any recent difficulty with anxiety.  Patient denies difficulty with sleep initiation or maintenance. Denies appetite disturbance.  Patient reports that energy and motivation have been good.  Patient denies any difficulty with concentration.  Patient denies any suicidal ideation.  Past Psychiatric Medication Trials: lithium, Seroquel 700, Adderall And others   Review of Systems:  Review of Systems  Cardiovascular: Negative for palpitations.  Musculoskeletal: Positive for arthralgias.  Neurological: Negative for tremors and weakness.    Medications: I have reviewed the patient's current medications.  Current Outpatient Medications  Medication Sig Dispense Refill  . albuterol (PROVENTIL) (2.5 MG/3ML) 0.083% nebulizer solution Take 3 mLs (2.5 mg total) by nebulization every 6 (six) hours as needed for wheezing or shortness of breath. 150 mL 1  . ALPRAZolam (XANAX) 0.5 MG tablet Take 1 tablet (0.5 mg total) by mouth at bedtime as needed for anxiety. 40 tablet 5  . amphetamine-dextroamphetamine (ADDERALL) 20 MG tablet Take 1 tablet (20 mg total) by mouth 3 (three) times daily. 90 tablet 0  .  amphetamine-dextroamphetamine (ADDERALL) 20 MG tablet Take 1 tablet (20 mg total) by mouth 3 (three) times daily. 90 tablet 0  . ibuprofen (ADVIL) 600 MG tablet Take 1 tablet (600 mg total) by mouth every 8 (eight) hours as needed. 30 tablet 0  . Ipratropium-Albuterol (COMBIVENT) 20-100 MCG/ACT AERS respimat Inhale 1 puff into the lungs 4 (four) times daily as needed. 1 Inhaler 5  . polyethylene glycol powder (GLYCOLAX/MIRALAX) powder Take 17 g by mouth 2 (two) times daily. Until daily soft stools  OTC 500 g 0  . QUEtiapine (SEROQUEL XR) 300 MG 24 hr tablet Take 2 tablets (600 mg total) by mouth daily. 180 tablet 1  . QUEtiapine (SEROQUEL) 100 MG tablet Take 1 tablet (100 mg total) by mouth at bedtime as needed (to aid with sleep). 90 tablet 1  . amphetamine-dextroamphetamine (ADDERALL) 20 MG tablet Take 1 tablet (20 mg total) by mouth 3 (three) times daily. 90 tablet 0   No current facility-administered medications for this visit.    Medication Side Effects: None  Allergies:  Allergies  Allergen Reactions  . Cephalexin Rash  . Sulfa Antibiotics Nausea And Vomiting    Severe nausea/vomiting/ GI upset  . Hydrocodone Nausea And Vomiting  . Sulfa Antibiotics Nausea And Vomiting  . Hydrocodone-Acetaminophen Nausea And Vomiting    Oxycodone does not cause same reaction    Past Medical History:  Diagnosis Date  . Anxiety   . Arthritis   . COPD (chronic obstructive pulmonary disease) ([place])   . Hypoglycemia   . Insomnia   . Kidney cysts   .  Multiple fractures of ribs of right side May 1982   multiple rib fracture and repair with plates and wires    Family History  Problem Relation Age of Onset  . Sudden Cardiac Death Neg Hx     Social History   Socioeconomic History  . Marital status: Single    Spouse name: Not on file  . Number of children: Not on file  . Years of education: Not on file  . Highest education level: Not on file  Occupational History  . Not on file   Tobacco Use  . Smoking status: Current Every Day Smoker    Packs/day: 0.50    Years: 42.00    Pack years: 21.00    Types: Cigarettes  . Smokeless tobacco: Never Used  Substance and Sexual Activity  . Alcohol use: Yes    Comment: occassionally  . Drug use: Yes    Types: Marijuana  . Sexual activity: Not Currently  Other Topics Concern  . Not on file  Social History Narrative   ** Merged History Encounter **       Social Determinants of Health   Financial Resource Strain:   . Difficulty of Paying Living Expenses: Not on file  Food Insecurity:   . Worried About Charity fundraiser in the Last Year: Not on file  . Ran Out of Food in the Last Year: Not on file  Transportation Needs:   . Lack of Transportation (Medical): Not on file  . Lack of Transportation (Non-Medical): Not on file  Physical Activity:   . Days of Exercise per Week: Not on file  . Minutes of Exercise per Session: Not on file  Stress:   . Feeling of Stress : Not on file  Social Connections:   . Frequency of Communication with Friends and Family: Not on file  . Frequency of Social Gatherings with Friends and Family: Not on file  . Attends Religious Services: Not on file  . Active Member of Clubs or Organizations: Not on file  . Attends Archivist Meetings: Not on file  . Marital Status: Not on file  Intimate Partner Violence:   . Fear of Current or Ex-Partner: Not on file  . Emotionally Abused: Not on file  . Physically Abused: Not on file  . Sexually Abused: Not on file    Past Medical History, Surgical history, Social history, and Family history were reviewed and updated as appropriate.   Please see review of systems for further details on the patient's review from today.   Objective:   Physical Exam:  There were no vitals taken for this visit.  Physical Exam Constitutional:      General: She is not in acute distress.    Appearance: She is well-developed.  Musculoskeletal:         General: No deformity.  Neurological:     Mental Status: She is alert and oriented to person, place, and time.     Coordination: Coordination normal.  Psychiatric:        Attention and Perception: Attention and perception normal. She does not perceive auditory or visual hallucinations.        Mood and Affect: Mood is not anxious or depressed. Affect is not labile, blunt, angry or inappropriate.        Speech: Speech normal.        Behavior: Behavior normal.        Thought Content: Thought content normal. Thought content is not paranoid or delusional. Thought  content does not include homicidal or suicidal ideation. Thought content does not include homicidal or suicidal plan.        Cognition and Memory: Cognition and memory normal.        Judgment: Judgment normal.     Comments: Insight intact Occasional irritability     Lab Review:     Component Value Date/Time   NA 137 01/02/2018 0542   K 3.9 01/02/2018 0542   CL 106 01/02/2018 0542   CO2 23 01/02/2018 0542   GLUCOSE 107 (H) 01/02/2018 0542   BUN 13 01/02/2018 0542   CREATININE 0.73 01/02/2018 0542   CALCIUM 10.2 01/02/2018 0542   PROT 7.0 12/26/2017 1702   ALBUMIN 3.9 12/26/2017 1702   AST 23 12/26/2017 1702   ALT 18 12/26/2017 1702   ALKPHOS 93 12/26/2017 1702   BILITOT 0.4 12/26/2017 1702   GFRNONAA >60 01/02/2018 0542   GFRAA >60 01/02/2018 0542       Component Value Date/Time   WBC 12.5 (H) 01/02/2018 0542   RBC 3.62 (L) 01/02/2018 0542   HGB 11.2 (L) 01/02/2018 0542   HCT 34.2 (L) 01/02/2018 0542   PLT 343 01/02/2018 0542   MCV 94.5 01/02/2018 0542   MCH 30.9 01/02/2018 0542   MCHC 32.7 01/02/2018 0542   RDW 12.9 01/02/2018 0542   LYMPHSABS 1.7 01/02/2018 0542   MONOABS 0.9 01/02/2018 0542   EOSABS 0.0 01/02/2018 0542   BASOSABS 0.1 01/02/2018 0542    No results found for: POCLITH, LITHIUM   No results found for: PHENYTOIN, PHENOBARB, VALPROATE, CBMZ   .res Assessment: Plan:    Asianna was seen  today for follow-up, anxiety and add.  Diagnoses and all orders for this visit:  Bipolar mixed affective disorder, moderate (HCC) -     QUEtiapine (SEROQUEL XR) 300 MG 24 hr tablet; Take 2 tablets (600 mg total) by mouth daily. -     QUEtiapine (SEROQUEL) 100 MG tablet; Take 1 tablet (100 mg total) by mouth at bedtime as needed (to aid with sleep).  Attention deficit hyperactivity disorder (ADHD), combined type -     amphetamine-dextroamphetamine (ADDERALL) 20 MG tablet; Take 1 tablet (20 mg total) by mouth 3 (three) times daily. -     amphetamine-dextroamphetamine (ADDERALL) 20 MG tablet; Take 1 tablet (20 mg total) by mouth 3 (three) times daily. -     amphetamine-dextroamphetamine (ADDERALL) 20 MG tablet; Take 1 tablet (20 mg total) by mouth 3 (three) times daily.  Generalized anxiety disorder -     ALPRAZolam (XANAX) 0.5 MG tablet; Take 1 tablet (0.5 mg total) by mouth at bedtime as needed for anxiety.  Insomnia due to mental condition -     ALPRAZolam (XANAX) 0.5 MG tablet; Take 1 tablet (0.5 mg total) by mouth at bedtime as needed for anxiety.    The patient has not been seen in the last year because insurance required that she change providers.  She has returned to this practice per her choice.  Patient has bipolar disorder mixed type with some degree of chronic symptoms.  She has very little insight into the nature of bipolar disorder.  She focuses on anxiety and need for the Adderall for focus.  She is somewhat chronically scattered with benefit from the Adderall.  Overall she appears more stable than when last seen.  She is not as hyperverbal or pressured or intense.  The Seroquel XR seems to be working well as a mood stabilizer and also helps her with her  sleep.  Appear to be to be abusing any medications nor has that been a problem.  PDMP was reviewed  Continue Seroquel XR 600 mg nightly plus Seroquel immediate release 100 mg nightly.  The combination helps her sleep as well as  provide mood benefit.  Continue the Adderall 20 mg 3 times daily  Continue Xanax 0.5 mg nightly or daily as needed anxiety or insomnia.  She asked for a few extra per month.  She has been getting 30/month.  It is reasonable for her to have an occasional extra 1 but she was informed that this does not work well with Adderall and to keep as needed use at a minimum.  We discussed the short-term risks associated with benzodiazepines including sedation and increased fall risk among others.  Discussed long-term side effect risk including dependence, potential withdrawal symptoms, and the potential eventual dose-related risk of dementia.  Discussed potential benefits, risks, and side effects of stimulants with patient to include increased heart rate, palpitations, insomnia, increased anxiety, increased irritability, or decreased appetite.  Instructed patient to contact office if experiencing any significant tolerability issues.  No med changes today  Follow-up in a few months  Lynder Parents MD, DFAPA Please see After Visit Summary for patient specific instructions.  Future Appointments  Date Time Provider [facility]  10/27/2019 10:15 AM Cottle, Billey Co., MD [facility] None    No orders of the defined types were placed in this encounter.   -------------------------------

## 2019-04-30 ENCOUNTER — Telehealth: Payer: Self-pay

## 2019-04-30 NOTE — Telephone Encounter (Signed)
Prior authorization submitted for Quetiapine ER 300 mg #180 for 90 days through [employer] [phone], [employer] ID# [id number] Approved effective 04/30/2019-04/29/2020 PA# [id number]  Submitted through cover my meds

## 2019-07-24 ENCOUNTER — Ambulatory Visit: Payer: Medicaid Other | Attending: Internal Medicine

## 2019-07-24 DIAGNOSIS — Z23 Encounter for immunization: Secondary | ICD-10-CM

## 2019-07-24 NOTE — Progress Notes (Signed)
   Covid-19 Vaccination Clinic  Name:  [name]    MRN: [id number] DOB: Apr 21, 1955  07/24/2019  [name] was observed post Covid-19 immunization for 15 minutes without incident. She was provided with Vaccine Information Sheet and instruction to access the V-Safe system.   [name] was instructed to call 911 with any severe reactions post vaccine: Marland Kitchen Difficulty breathing  . Swelling of face and throat  . A fast heartbeat  . A bad rash all over body  . Dizziness and weakness   Immunizations Administered    Name Date Dose VIS Date Route   Pfizer COVID-19 Vaccine 07/24/2019  8:22 AM 0.3 mL 05/28/2018 Intramuscular   Manufacturer: [place]   Lot: [id number]   [place]: [id number]

## 2019-08-01 ENCOUNTER — Other Ambulatory Visit: Payer: Self-pay | Admitting: Psychiatry

## 2019-08-01 ENCOUNTER — Telehealth: Payer: Self-pay | Admitting: Psychiatry

## 2019-08-01 DIAGNOSIS — F902 Attention-deficit hyperactivity disorder, combined type: Secondary | ICD-10-CM

## 2019-08-01 DIAGNOSIS — F411 Generalized anxiety disorder: Secondary | ICD-10-CM

## 2019-08-01 DIAGNOSIS — F5105 Insomnia due to other mental disorder: Secondary | ICD-10-CM

## 2019-08-01 MED ORDER — AMPHETAMINE-DEXTROAMPHETAMINE 20 MG PO TABS: 20.0000 mg | ORAL_TABLET | Freq: Three times a day (TID) | ORAL | 0 refills | Status: DC

## 2019-08-01 MED ORDER — ALPRAZOLAM 0.5 MG PO TABS: ORAL_TABLET | ORAL | 2 refills | Status: DC

## 2019-08-01 NOTE — Telephone Encounter (Signed)
Rewrote alprazolam prescription to be more specific and sent 3 more prescriptions for Adderall.

## 2019-08-01 NOTE — Telephone Encounter (Signed)
Patient can get number 40 tablets every 30 days.  I do not want her taking alprazolam during the day more than 10 days/month.  It looks like she is has several refills.

## 2019-08-01 NOTE — Telephone Encounter (Signed)
Pt is requesting a RF on Xanax,looks like she is requesting early RF. They also want to clarify the quantity of 40 for a 30 day supply?

## 2019-08-18 ENCOUNTER — Ambulatory Visit: Payer: Medicaid Other | Attending: Internal Medicine

## 2019-08-18 DIAGNOSIS — Z23 Encounter for immunization: Secondary | ICD-10-CM

## 2019-08-18 NOTE — Progress Notes (Signed)
   Covid-19 Vaccination Clinic  Name:  [name]    MRN: [id number] DOB: April 04, 1955  08/18/2019  [name] was observed post Covid-19 immunization for 15 minutes without incident. She was provided with Vaccine Information Sheet and instruction to access the V-Safe system.   [name] was instructed to call 911 with any severe reactions post vaccine: Marland Kitchen Difficulty breathing  . Swelling of face and throat  . A fast heartbeat  . A bad rash all over body  . Dizziness and weakness   Immunizations Administered    Name Date Dose VIS Date Route   Pfizer COVID-19 Vaccine 08/18/2019  8:17 AM 0.3 mL 05/28/2018 Intramuscular   Manufacturer: [place]   Lot: [id number]   [place]: [id number]

## 2019-10-27 ENCOUNTER — Encounter: Payer: Self-pay | Admitting: Psychiatry

## 2019-10-27 ENCOUNTER — Other Ambulatory Visit: Payer: Self-pay

## 2019-10-27 ENCOUNTER — Ambulatory Visit (INDEPENDENT_AMBULATORY_CARE_PROVIDER_SITE_OTHER): Payer: Self-pay | Admitting: Psychiatry

## 2019-10-27 DIAGNOSIS — F5105 Insomnia due to other mental disorder: Secondary | ICD-10-CM

## 2019-10-27 DIAGNOSIS — G3184 Mild cognitive impairment, so stated: Secondary | ICD-10-CM

## 2019-10-27 DIAGNOSIS — F3162 Bipolar disorder, current episode mixed, moderate: Secondary | ICD-10-CM

## 2019-10-27 DIAGNOSIS — F3181 Bipolar II disorder: Secondary | ICD-10-CM

## 2019-10-27 DIAGNOSIS — F902 Attention-deficit hyperactivity disorder, combined type: Secondary | ICD-10-CM

## 2019-10-27 DIAGNOSIS — G4726 Circadian rhythm sleep disorder, shift work type: Secondary | ICD-10-CM

## 2019-10-27 DIAGNOSIS — F411 Generalized anxiety disorder: Secondary | ICD-10-CM

## 2019-10-27 MED ORDER — QUETIAPINE FUMARATE 100 MG PO TABS: 100.0000 mg | ORAL_TABLET | Freq: Every evening | ORAL | 1 refills | Status: DC | PRN

## 2019-10-27 MED ORDER — AMPHETAMINE-DEXTROAMPHETAMINE 20 MG PO TABS: 20.0000 mg | ORAL_TABLET | Freq: Three times a day (TID) | ORAL | 0 refills | Status: DC

## 2019-10-27 MED ORDER — ALPRAZOLAM 0.5 MG PO TABS: ORAL_TABLET | ORAL | 2 refills | Status: DC

## 2019-10-27 MED ORDER — QUETIAPINE FUMARATE ER 300 MG PO TB24: 600.0000 mg | ORAL_TABLET | Freq: Every day | ORAL | 1 refills | Status: DC

## 2019-10-27 NOTE — Progress Notes (Signed)
[name] [id number] 04-08-1955 64 y.o.  Subjective:   Patient ID:  [name] is a 64 y.o. (DOB Nov 28, 1955) female.  Chief Complaint:  Chief Complaint  Patient presents with  . Follow-up  . Stress  . Anxiety    HPI [name] WNIOEVOJ presents to the office today for follow-up of bipolar disorder and ADD and anxiety and sleep.  January 2021 appointment the following is noted: Doing better this year.  Coming here again bc closer for her and her only transportation is scootter. Seroquel XR as mood  Med and IR 100 mg HS for sleep.  Overall mood stability good with occ anger outbursts. Still on Adderall and needs it as before and benefits from it.  Pleased with meds. No med changes were made.  10/27/2019 appointment with the following noted: Stress roommate moving out and she doesn't know what to do.   Insurance change and not covered here.  Rides scooter and wants to continue here. $stress.  She checked into [facility]. Consistent with meds now.  Was skipping nights.  PCP notices more stable on quetiapine than if skips.     Patient reports stable mood and denies depressed or irritable moods.  Anxious situationally.    Patient denies difficulty with sleep initiation or maintenance. Denies appetite disturbance.  Patient reports that energy and motivation have been good.  Patient denies any difficulty with concentration.  Patient denies any suicidal ideation.  Past Psychiatric Medication Trials: lithium, Seroquel 700, Adderall And others   Review of Systems:  Review of Systems  Cardiovascular: Negative for chest pain and palpitations.  Musculoskeletal: Positive for arthralgias.  Neurological: Negative for tremors and weakness.    Medications: I have reviewed the patient's current medications.  Current Outpatient Medications  Medication Sig Dispense Refill  . albuterol (PROVENTIL) (2.5 MG/3ML) 0.083% nebulizer solution Take 3 mLs (2.5 mg total) by  nebulization every 6 (six) hours as needed for wheezing or shortness of breath. 150 mL 1  . ibuprofen (ADVIL) 600 MG tablet Take 1 tablet (600 mg total) by mouth every 8 (eight) hours as needed. 30 tablet 0  . Ipratropium-Albuterol (COMBIVENT) 20-100 MCG/ACT AERS respimat Inhale 1 puff into the lungs 4 (four) times daily as needed. 1 Inhaler 5  . polyethylene glycol powder (GLYCOLAX/MIRALAX) powder Take 17 g by mouth 2 (two) times daily. Until daily soft stools  OTC 500 g 0  . ALPRAZolam (XANAX) 0.5 MG tablet 1 nightly and 1 daily as needed for anxiety.  Maximum #40 tablets every 30 days 40 tablet 2  . amphetamine-dextroamphetamine (ADDERALL) 20 MG tablet Take 1 tablet (20 mg total) by mouth 3 (three) times daily. 90 tablet 0  . [START ON 11/24/2019] amphetamine-dextroamphetamine (ADDERALL) 20 MG tablet Take 1 tablet (20 mg total) by mouth 3 (three) times daily. 90 tablet 0  . [START ON 12/22/2019] amphetamine-dextroamphetamine (ADDERALL) 20 MG tablet Take 1 tablet (20 mg total) by mouth 3 (three) times daily. 90 tablet 0  . QUEtiapine (SEROQUEL XR) 300 MG 24 hr tablet Take 2 tablets (600 mg total) by mouth daily. 180 tablet 1  . QUEtiapine (SEROQUEL) 100 MG tablet Take 1 tablet (100 mg total) by mouth at bedtime as needed (to aid with sleep). 90 tablet 1   No current facility-administered medications for this visit.    Medication Side Effects: None  Allergies:  Allergies  Allergen Reactions  . Cephalexin Rash  . Sulfa Antibiotics Nausea And Vomiting    Severe nausea/vomiting/ GI  upset  . Hydrocodone Nausea And Vomiting  . Sulfa Antibiotics Nausea And Vomiting  . Hydrocodone-Acetaminophen Nausea And Vomiting    Oxycodone does not cause same reaction    Past Medical History:  Diagnosis Date  . Anxiety   . Arthritis   . COPD (chronic obstructive pulmonary disease) ([facility])   . Hypoglycemia   . Insomnia   . Kidney cysts   . Multiple fractures of ribs of right side May 1982   multiple  rib fracture and repair with plates and wires    Family History  Problem Relation Age of Onset  . Sudden Cardiac Death Neg Hx     Social History   Socioeconomic History  . Marital status: Single    Spouse name: Not on file  . Number of children: Not on file  . Years of education: Not on file  . Highest education level: Not on file  Occupational History  . Not on file  Tobacco Use  . Smoking status: Current Every Day Smoker    Packs/day: 0.50    Years: 42.00    Pack years: 21.00    Types: Cigarettes  . Smokeless tobacco: Never Used  Vaping Use  . Vaping Use: Every day  Substance and Sexual Activity  . Alcohol use: Yes    Comment: occassionally  . Drug use: Yes    Types: Marijuana  . Sexual activity: Not Currently  Other Topics Concern  . Not on file  Social History Narrative   ** Merged History Encounter **       Social Determinants of Health   Financial Resource Strain:   . Difficulty of Paying Living Expenses:   Food Insecurity:   . Worried About Charity fundraiser in the Last Year:   . Arboriculturist in the Last Year:   Transportation Needs:   . Film/video editor (Medical):   Marland Kitchen Lack of Transportation (Non-Medical):   Physical Activity:   . Days of Exercise per Week:   . Minutes of Exercise per Session:   Stress:   . Feeling of Stress :   Social Connections:   . Frequency of Communication with Friends and Family:   . Frequency of Social Gatherings with Friends and Family:   . Attends Religious Services:   . Active Member of Clubs or Organizations:   . Attends Archivist Meetings:   Marland Kitchen Marital Status:   Intimate Partner Violence:   . Fear of Current or Ex-Partner:   . Emotionally Abused:   Marland Kitchen Physically Abused:   . Sexually Abused:     Past Medical History, Surgical history, Social history, and Family history were reviewed and updated as appropriate.   Please see review of systems for further details on the patient's review from  today.   Objective:   Physical Exam:  There were no vitals taken for this visit.  Physical Exam Constitutional:      General: She is not in acute distress.    Appearance: She is well-developed.  Musculoskeletal:        General: No deformity.  Neurological:     Mental Status: She is alert and oriented to person, place, and time.     Coordination: Coordination normal.  Psychiatric:        Attention and Perception: Attention and perception normal. She does not perceive auditory or visual hallucinations.        Mood and Affect: Mood is anxious. Mood is not depressed. Affect is not labile,  blunt, angry or inappropriate.        Speech: Speech normal.        Behavior: Behavior normal.        Thought Content: Thought content normal. Thought content is not paranoid or delusional. Thought content does not include homicidal or suicidal ideation. Thought content does not include homicidal or suicidal plan.        Cognition and Memory: Cognition and memory normal.        Judgment: Judgment normal.     Comments: Insight intact Occasional irritability Affect more stable.     Lab Review:     Component Value Date/Time   NA 137 01/02/2018 0542   K 3.9 01/02/2018 0542   CL 106 01/02/2018 0542   CO2 23 01/02/2018 0542   GLUCOSE 107 (H) 01/02/2018 0542   BUN 13 01/02/2018 0542   CREATININE 0.73 01/02/2018 0542   CALCIUM 10.2 01/02/2018 0542   PROT 7.0 12/26/2017 1702   ALBUMIN 3.9 12/26/2017 1702   AST 23 12/26/2017 1702   ALT 18 12/26/2017 1702   ALKPHOS 93 12/26/2017 1702   BILITOT 0.4 12/26/2017 1702   GFRNONAA >60 01/02/2018 0542   GFRAA >60 01/02/2018 0542       Component Value Date/Time   WBC 12.5 (H) 01/02/2018 0542   RBC 3.62 (L) 01/02/2018 0542   HGB 11.2 (L) 01/02/2018 0542   HCT 34.2 (L) 01/02/2018 0542   PLT 343 01/02/2018 0542   MCV 94.5 01/02/2018 0542   MCH 30.9 01/02/2018 0542   MCHC 32.7 01/02/2018 0542   RDW 12.9 01/02/2018 0542   LYMPHSABS 1.7 01/02/2018  0542   MONOABS 0.9 01/02/2018 0542   EOSABS 0.0 01/02/2018 0542   BASOSABS 0.1 01/02/2018 0542    No results found for: POCLITH, LITHIUM   No results found for: PHENYTOIN, PHENOBARB, VALPROATE, CBMZ   .res Assessment: Plan:    Karilyn was seen today for follow-up, stress and anxiety.  Diagnoses and all orders for this visit:  Generalized anxiety disorder -     ALPRAZolam (XANAX) 0.5 MG tablet; 1 nightly and 1 daily as needed for anxiety.  Maximum #40 tablets every 30 days  Bipolar II disorder (HCC)  Insomnia due to mental condition -     ALPRAZolam (XANAX) 0.5 MG tablet; 1 nightly and 1 daily as needed for anxiety.  Maximum #40 tablets every 30 days  Attention deficit hyperactivity disorder (ADHD), combined type -     amphetamine-dextroamphetamine (ADDERALL) 20 MG tablet; Take 1 tablet (20 mg total) by mouth 3 (three) times daily. -     amphetamine-dextroamphetamine (ADDERALL) 20 MG tablet; Take 1 tablet (20 mg total) by mouth 3 (three) times daily. -     amphetamine-dextroamphetamine (ADDERALL) 20 MG tablet; Take 1 tablet (20 mg total) by mouth 3 (three) times daily.  Shift work sleep disorder  Mild cognitive impairment  Bipolar mixed affective disorder, moderate (HCC) -     QUEtiapine (SEROQUEL XR) 300 MG 24 hr tablet; Take 2 tablets (600 mg total) by mouth daily. -     QUEtiapine (SEROQUEL) 100 MG tablet; Take 1 tablet (100 mg total) by mouth at bedtime as needed (to aid with sleep).    The patient has not been seen in the last year because insurance required that she change providers.  She has returned to this practice per her choice.  Patient has bipolar disorder mixed type with some degree of chronic symptoms.  She has very little insight into the nature  of bipolar disorder.  She focuses on anxiety and need for the Adderall for focus.  She is somewhat chronically scattered with benefit from the Adderall.  Overall she appears more stable than when last seen.  She is not as  hyperverbal or pressured or intense.  The Seroquel XR seems to be working well as a mood stabilizer and also helps her with her sleep.  Appear to be to be abusing any medications nor has that been a problem.  PDMP was reviewed  Continue Seroquel XR 600 mg nightly plus Seroquel immediate release 100 mg nightly.  The combination helps her sleep as well as provide mood benefit.  Others see the benefit but she's not sure.  Continue the Adderall 20 mg 3 times daily  Continue Xanax 0.5 mg nightly or daily as needed anxiety or insomnia.  She asked for a few extra per month.  She has been getting 30/month.  It is reasonable for her to have an occasional extra 1 but she was informed that this does not work well with Adderall and to keep as needed use at a minimum.  We discussed the short-term risks associated with benzodiazepines including sedation and increased fall risk among others.  Discussed long-term side effect risk including dependence, potential withdrawal symptoms, and the potential eventual dose-related risk of dementia.  Discussed potential benefits, risks, and side effects of stimulants with patient to include increased heart rate, palpitations, insomnia, increased anxiety, increased irritability, or decreased appetite.  Instructed patient to contact office if experiencing any significant tolerability issues.  No med changes today  Disc getting support   Follow-up in a few months DT costs  Lynder Parents MD, DFAPA Please see After Visit Summary for patient specific instructions.  No future appointments.  No orders of the defined types were placed in this encounter.   -------------------------------

## 2019-10-27 NOTE — Patient Instructions (Signed)
[employer] [place] housing assistance

## 2019-10-29 ENCOUNTER — Other Ambulatory Visit: Payer: Self-pay | Admitting: Psychiatry

## 2019-10-29 DIAGNOSIS — F3162 Bipolar disorder, current episode mixed, moderate: Secondary | ICD-10-CM

## 2020-01-22 ENCOUNTER — Other Ambulatory Visit: Payer: Self-pay

## 2020-01-22 ENCOUNTER — Telehealth: Payer: Self-pay | Admitting: Psychiatry

## 2020-01-22 DIAGNOSIS — F5105 Insomnia due to other mental disorder: Secondary | ICD-10-CM

## 2020-01-22 DIAGNOSIS — F902 Attention-deficit hyperactivity disorder, combined type: Secondary | ICD-10-CM

## 2020-01-22 DIAGNOSIS — F411 Generalized anxiety disorder: Secondary | ICD-10-CM

## 2020-01-22 NOTE — Telephone Encounter (Signed)
[name] called to request refill of her Adderall and Xanax.  Appt 04/28/20.  Send to [facility] in [place].

## 2020-01-22 NOTE — Telephone Encounter (Signed)
Last refill 12/29/2019 will pend when appropriate

## 2020-01-26 MED ORDER — ALPRAZOLAM 0.5 MG PO TABS: ORAL_TABLET | ORAL | 2 refills | Status: DC

## 2020-01-26 MED ORDER — AMPHETAMINE-DEXTROAMPHETAMINE 20 MG PO TABS: 20.0000 mg | ORAL_TABLET | Freq: Three times a day (TID) | ORAL | 0 refills | Status: DC

## 2020-01-26 NOTE — Telephone Encounter (Signed)
Please review and send 

## 2020-01-27 ENCOUNTER — Telehealth: Payer: Self-pay | Admitting: Psychiatry

## 2020-01-27 NOTE — Telephone Encounter (Signed)
Rx's were all submitted yesterday

## 2020-01-27 NOTE — Telephone Encounter (Signed)
Patient has an appt with Dr. Clovis Pu on 04/28/20. Needs refill on Xanax and Adderall called to [facility] on [address] in [place].

## 2020-02-20 ENCOUNTER — Other Ambulatory Visit: Payer: Self-pay | Admitting: Psychiatry

## 2020-02-20 ENCOUNTER — Telehealth: Payer: Self-pay | Admitting: Psychiatry

## 2020-02-20 NOTE — Telephone Encounter (Signed)
Pharmacy change made

## 2020-02-20 NOTE — Telephone Encounter (Signed)
Pt lm to refill medication going forward to the [facility] in [place]. Next appt scheduled for 1/26.

## 2020-03-14 IMAGING — DX DG KNEE COMPLETE 4+V*R*
4 series · 4 of 4 positions shown · non-contrast
Comparison: 12/15/2017

CLINICAL DATA: Right knee pain and swelling. No recent injury.

EXAM:
RIGHT KNEE - COMPLETE 4+ VIEW

[knee ap]
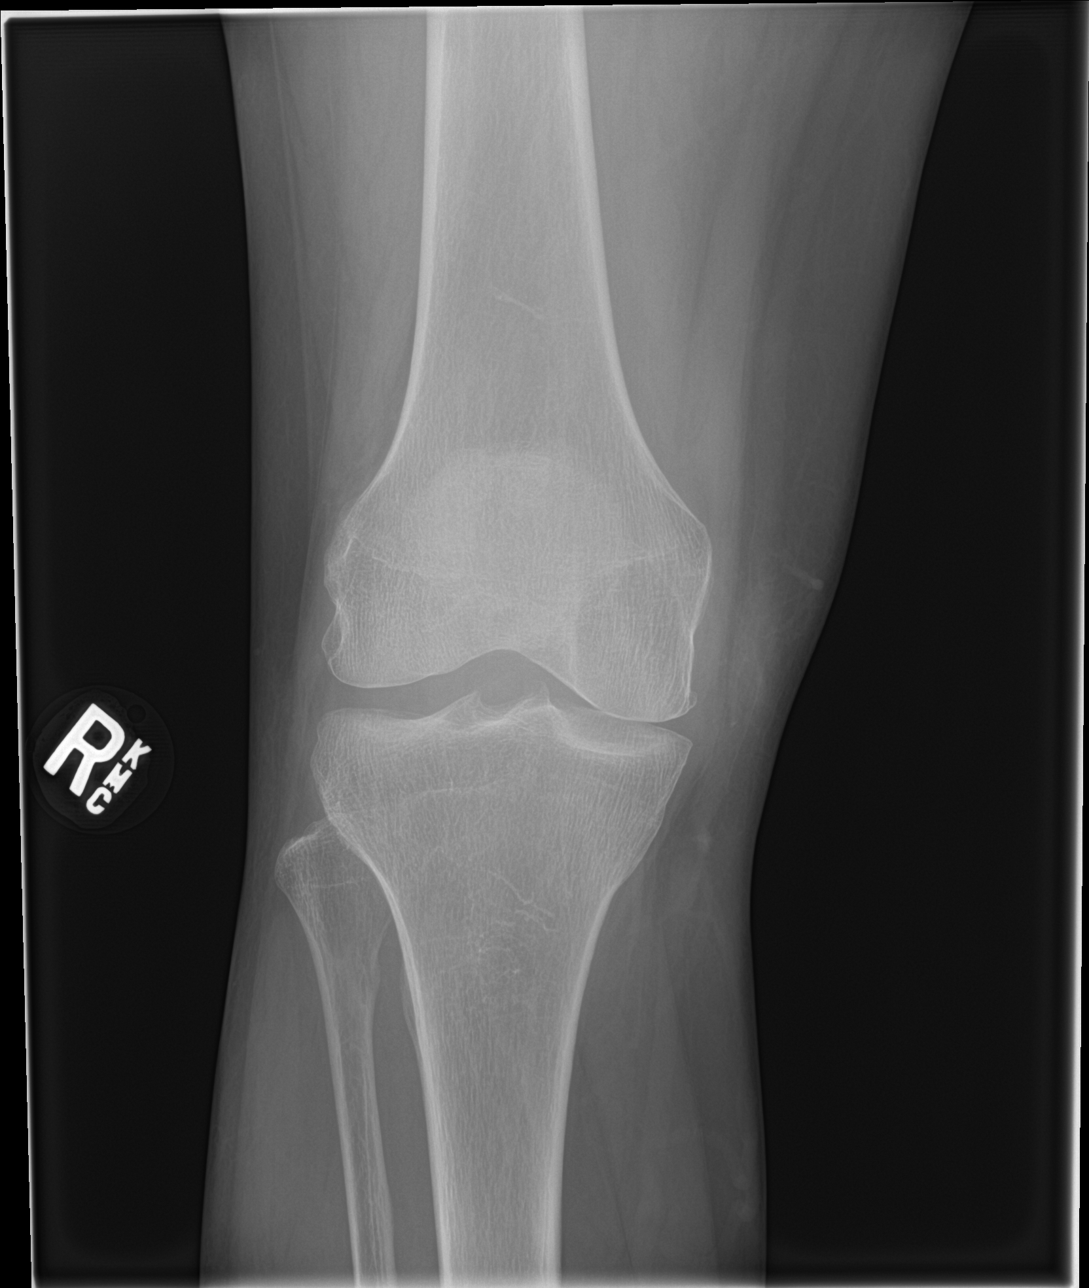

[knee tunnel]
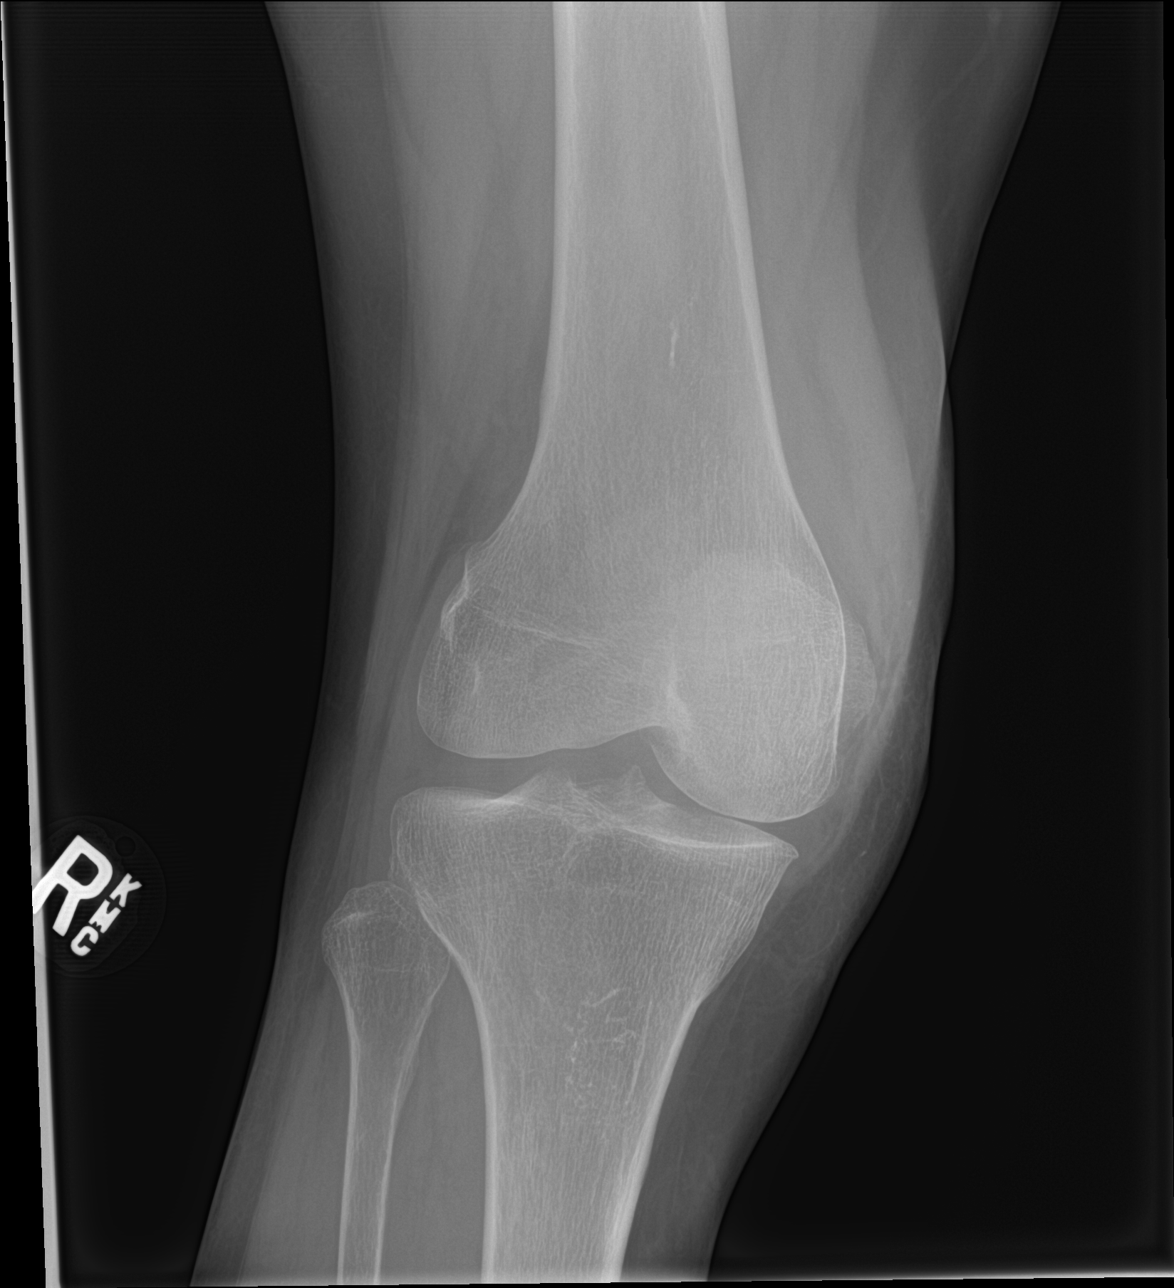

[knee lat]
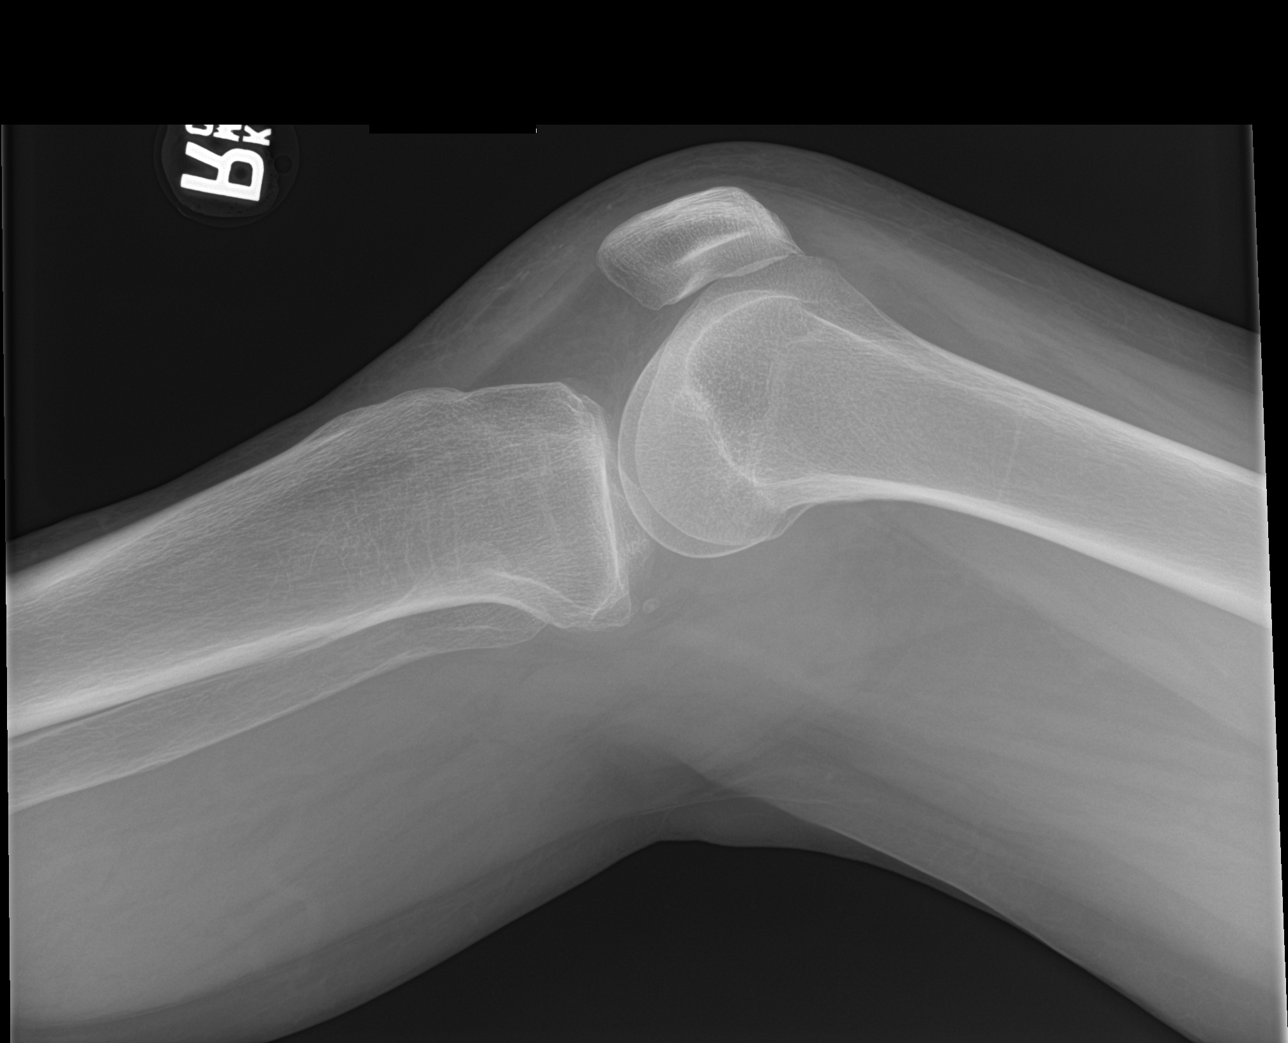

[knee obl]
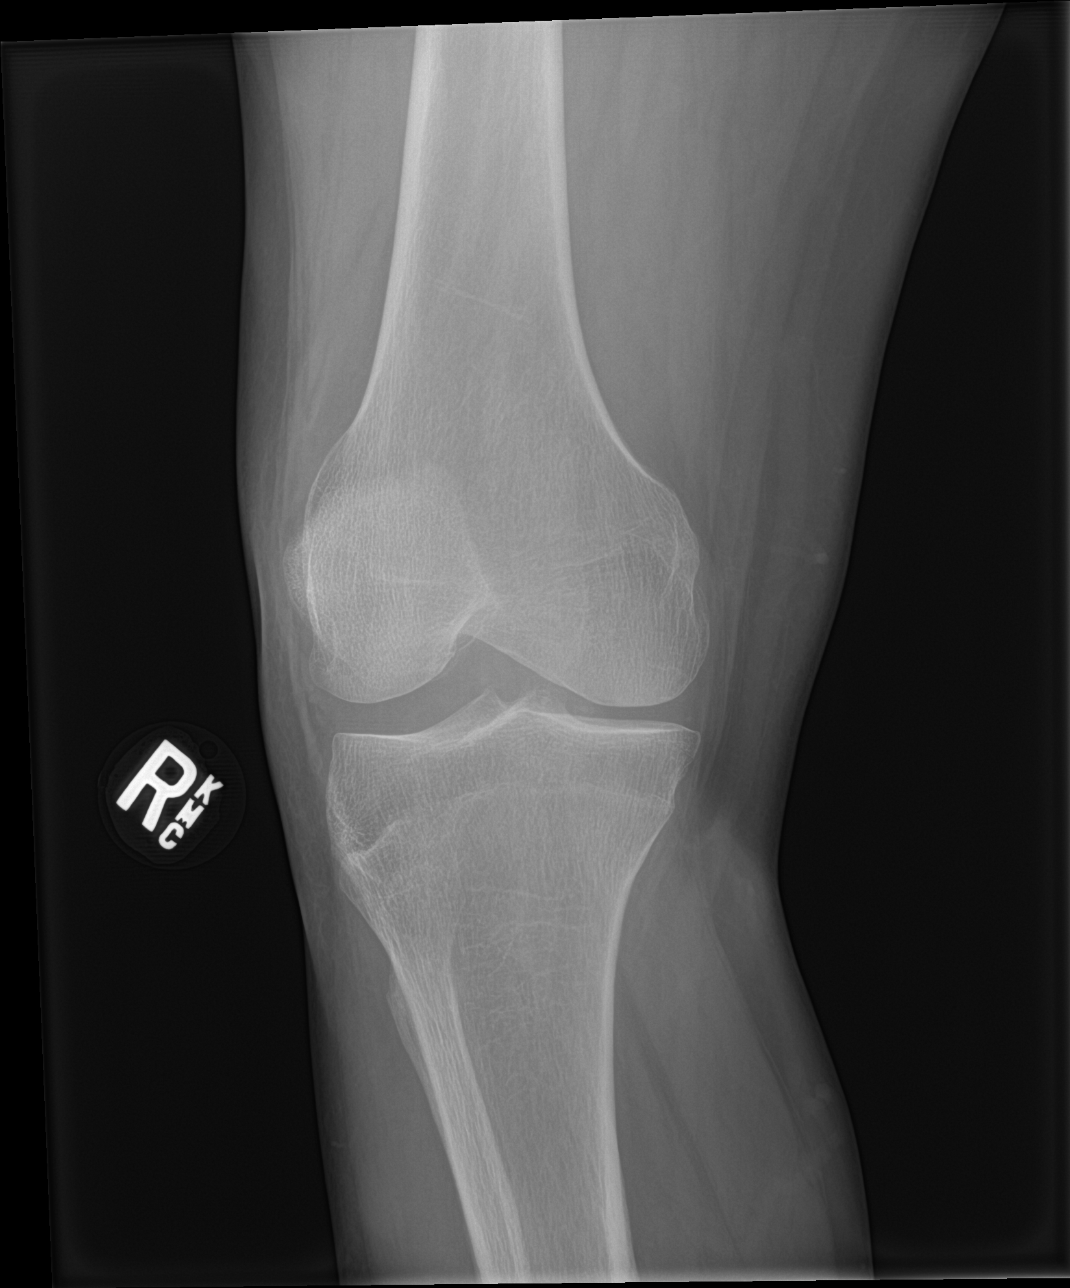

[4 of 4 positions shown; findings below may reference images not displayed]

FINDINGS: No evidence of fracture, dislocation, or joint effusion. There is a
tiny calcified loose body in the posterior aspect of the joint which
appears to be new since the prior study.

Chronic calcifications in the soft tissues anterior to the patellar
tendon. Tiny marginal osteophytes in the medial compartment,
unchanged.
IMPRESSION: 1. Tiny new calcified loose body in the posterior aspect of the
joint.
2. No other significant abnormality.
3. Chronic calcifications in the soft tissues anterior to the
patellar tendon.

## 2020-04-22 ENCOUNTER — Telehealth: Payer: Self-pay | Admitting: Psychiatry

## 2020-04-22 ENCOUNTER — Other Ambulatory Visit: Payer: Self-pay | Admitting: Psychiatry

## 2020-04-22 DIAGNOSIS — F902 Attention-deficit hyperactivity disorder, combined type: Secondary | ICD-10-CM

## 2020-04-22 DIAGNOSIS — F411 Generalized anxiety disorder: Secondary | ICD-10-CM

## 2020-04-22 DIAGNOSIS — F5105 Insomnia due to other mental disorder: Secondary | ICD-10-CM

## 2020-04-22 MED ORDER — AMPHETAMINE-DEXTROAMPHETAMINE 20 MG PO TABS: 20.0000 mg | ORAL_TABLET | Freq: Three times a day (TID) | ORAL | 0 refills | Status: DC

## 2020-04-22 MED ORDER — ALPRAZOLAM 0.5 MG PO TABS: ORAL_TABLET | ORAL | 0 refills | Status: DC

## 2020-04-22 NOTE — Telephone Encounter (Signed)
Pt would like a refill on Adderall and Xanax. Pt would like the rx to be sent to new pharmacy. [facility] on [address], [place]

## 2020-04-23 ENCOUNTER — Other Ambulatory Visit: Payer: Self-pay | Admitting: Psychiatry

## 2020-04-23 DIAGNOSIS — F3162 Bipolar disorder, current episode mixed, moderate: Secondary | ICD-10-CM

## 2020-04-23 MED ORDER — QUETIAPINE FUMARATE 100 MG PO TABS: 100.0000 mg | ORAL_TABLET | Freq: Every evening | ORAL | 0 refills | Status: DC | PRN

## 2020-04-23 MED ORDER — QUETIAPINE FUMARATE ER 300 MG PO TB24: 600.0000 mg | ORAL_TABLET | Freq: Every day | ORAL | 0 refills | Status: DC

## 2020-04-23 NOTE — Telephone Encounter (Signed)
PT will also need her Seroquel (both dosages) sent in to [facility] on [address] in [place].

## 2020-04-28 ENCOUNTER — Encounter: Payer: Self-pay | Admitting: Psychiatry

## 2020-04-28 ENCOUNTER — Telehealth (INDEPENDENT_AMBULATORY_CARE_PROVIDER_SITE_OTHER): Payer: Self-pay | Admitting: Psychiatry

## 2020-04-28 DIAGNOSIS — F902 Attention-deficit hyperactivity disorder, combined type: Secondary | ICD-10-CM

## 2020-04-28 DIAGNOSIS — F5105 Insomnia due to other mental disorder: Secondary | ICD-10-CM

## 2020-04-28 DIAGNOSIS — F411 Generalized anxiety disorder: Secondary | ICD-10-CM

## 2020-04-28 DIAGNOSIS — F3162 Bipolar disorder, current episode mixed, moderate: Secondary | ICD-10-CM

## 2020-04-28 MED ORDER — QUETIAPINE FUMARATE ER 300 MG PO TB24: 600.0000 mg | ORAL_TABLET | Freq: Every day | ORAL | 1 refills | Status: DC

## 2020-04-28 MED ORDER — AMPHETAMINE-DEXTROAMPHETAMINE 20 MG PO TABS: 20.0000 mg | ORAL_TABLET | Freq: Three times a day (TID) | ORAL | 0 refills | Status: DC

## 2020-04-28 MED ORDER — QUETIAPINE FUMARATE 100 MG PO TABS: 100.0000 mg | ORAL_TABLET | Freq: Every evening | ORAL | 1 refills | Status: DC | PRN

## 2020-04-28 NOTE — Progress Notes (Signed)
[name] [id number] [birth year] 65 y.o.  Subjective:   Patient ID:  [name] is a 65 y.o. (DOB 04-11-1955) female.  Chief Complaint:  Chief Complaint  Patient presents with  . Follow-up  . Manic Behavior  . Anxiety  . ADHD    HPI [name] [id number] presents to the office today for follow-up of bipolar disorder and ADD and anxiety and sleep.  January 2021 appointment the following is noted: Doing better this year.  Coming here again bc closer for her and her only transportation is scootter. Seroquel XR as mood  Med and IR 100 mg HS for sleep.  Overall mood stability good with occ anger outbursts. Still on Adderall and needs it as before and benefits from it.  Pleased with meds. No med changes were made.  10/27/2019 appointment with the following noted: Stress roommate moving out and she doesn't know what to do.   Insurance change and not covered here.  Rides scooter and wants to continue here. $stress.  She checked into [facility]. Consistent with meds now.  Was skipping nights.  PCP notices more stable on quetiapine than if skips.    Plan: no med changes  04/28/2020 appointment with the following noted: Still on Adderall 20 TID and Seroquel XR 600 mg HS, Seroquel 100 HS.   Initially dizzy and resolved.   Retired and couldn't take shift work anymore. Sleep pretty good and no longer feels hungover in the morning. Getting a  Combivent and needs refill. Living in [place] with room mate and it's going pretty well. Kind of had a mood swing the other day with anger but first  In a while.  Hit something but then fixed it.  Depression comes and goes with situations usually.  Better focus with Adderall even in conversation.   Anxious situationally.    Patient denies difficulty with sleep initiation or maintenance. Denies appetite disturbance.  Patient reports that energy and motivation have been good.  Patient denies any difficulty with concentration.   Patient denies any suicidal ideation.  Past Psychiatric Medication Trials: lithium, Seroquel 700, Adderall And others Seroquel XR 600 with IR 100  Review of Systems:  Review of Systems  Respiratory: Positive for cough.   Cardiovascular: Negative for chest pain and palpitations.  Musculoskeletal: Positive for arthralgias.  Neurological: Negative for tremors and weakness.    Medications: I have reviewed the patient's current medications.  Current Outpatient Medications  Medication Sig Dispense Refill  . albuterol (PROVENTIL) (2.5 MG/3ML) 0.083% nebulizer solution Take 3 mLs (2.5 mg total) by nebulization every 6 (six) hours as needed for wheezing or shortness of breath. 150 mL 1  . ALPRAZolam (XANAX) 0.5 MG tablet 1 nightly and 1 daily as needed for anxiety.  Maximum #40 tablets every 30 days 40 tablet 0  . ibuprofen (ADVIL) 600 MG tablet Take 1 tablet (600 mg total) by mouth every 8 (eight) hours as needed. 30 tablet 0  . Ipratropium-Albuterol (COMBIVENT) 20-100 MCG/ACT AERS respimat Inhale 1 puff into the lungs 4 (four) times daily as needed. 1 Inhaler 5  . polyethylene glycol powder (GLYCOLAX/MIRALAX) powder Take 17 g by mouth 2 (two) times daily. Until daily soft stools  OTC 500 g 0  . amphetamine-dextroamphetamine (ADDERALL) 20 MG tablet Take 1 tablet (20 mg total) by mouth 3 (three) times daily. 90 tablet 0  . [START ON 05/26/2020] amphetamine-dextroamphetamine (ADDERALL) 20 MG tablet Take 1 tablet (20 mg total) by mouth 3 (three) times daily. 90 tablet  0  . [START ON 06/23/2020] amphetamine-dextroamphetamine (ADDERALL) 20 MG tablet Take 1 tablet (20 mg total) by mouth 3 (three) times daily. 90 tablet 0  . QUEtiapine (SEROQUEL XR) 300 MG 24 hr tablet Take 2 tablets (600 mg total) by mouth daily. 180 tablet 1  . QUEtiapine (SEROQUEL) 100 MG tablet Take 1 tablet (100 mg total) by mouth at bedtime as needed (to aid with sleep). 90 tablet 1   No current facility-administered medications  for this visit.    Medication Side Effects: None  Allergies:  Allergies  Allergen Reactions  . Cephalexin Rash  . Sulfa Antibiotics Nausea And Vomiting    Severe nausea/vomiting/ GI upset  . Hydrocodone Nausea And Vomiting  . Sulfa Antibiotics Nausea And Vomiting  . Hydrocodone-Acetaminophen Nausea And Vomiting    Oxycodone does not cause same reaction    Past Medical History:  Diagnosis Date  . Anxiety   . Arthritis   . COPD (chronic obstructive pulmonary disease) ([place])   . Hypoglycemia   . Insomnia   . Kidney cysts   . Multiple fractures of ribs of right side May 1982   multiple rib fracture and repair with plates and wires    Family History  Problem Relation Age of Onset  . Sudden Cardiac Death Neg Hx     Social History   Socioeconomic History  . Marital status: Single    Spouse name: Not on file  . Number of children: Not on file  . Years of education: Not on file  . Highest education level: Not on file  Occupational History  . Not on file  Tobacco Use  . Smoking status: Current Every Day Smoker    Packs/day: 0.50    Years: 42.00    Pack years: 21.00    Types: Cigarettes  . Smokeless tobacco: Never Used  Vaping Use  . Vaping Use: Every day  Substance and Sexual Activity  . Alcohol use: Yes    Comment: occassionally  . Drug use: Yes    Types: Marijuana  . Sexual activity: Not Currently  Other Topics Concern  . Not on file  Social History Narrative   ** Merged History Encounter **       Social Determinants of Health   Financial Resource Strain: Not on file  Food Insecurity: Not on file  Transportation Needs: Not on file  Physical Activity: Not on file  Stress: Not on file  Social Connections: Not on file  Intimate Partner Violence: Not on file    Past Medical History, Surgical history, Social history, and Family history were reviewed and updated as appropriate.   Please see review of systems for further details on the patient's review  from today.   Objective:   Physical Exam:  There were no vitals taken for this visit.  Physical Exam Neurological:     Mental Status: She is alert and oriented to person, place, and time.     Cranial Nerves: No dysarthria.  Psychiatric:        Attention and Perception: Attention and perception normal.        Mood and Affect: Mood is anxious. Mood is not depressed. Affect is labile.        Speech: Speech normal.        Behavior: Behavior is cooperative.        Thought Content: Thought content normal. Thought content is not paranoid or delusional. Thought content does not include homicidal or suicidal ideation. Thought content does not include  homicidal or suicidal plan.        Cognition and Memory: Cognition and memory normal.     Comments: Insight fair Judgment OK     Lab Review:     Component Value Date/Time   NA 137 01/02/2018 0542   K 3.9 01/02/2018 0542   CL 106 01/02/2018 0542   CO2 23 01/02/2018 0542   GLUCOSE 107 (H) 01/02/2018 0542   BUN 13 01/02/2018 0542   CREATININE 0.73 01/02/2018 0542   CALCIUM 10.2 01/02/2018 0542   PROT 7.0 12/26/2017 1702   ALBUMIN 3.9 12/26/2017 1702   AST 23 12/26/2017 1702   ALT 18 12/26/2017 1702   ALKPHOS 93 12/26/2017 1702   BILITOT 0.4 12/26/2017 1702   GFRNONAA >60 01/02/2018 0542   GFRAA >60 01/02/2018 0542       Component Value Date/Time   WBC 12.5 (H) 01/02/2018 0542   RBC 3.62 (L) 01/02/2018 0542   HGB 11.2 (L) 01/02/2018 0542   HCT 34.2 (L) 01/02/2018 0542   PLT 343 01/02/2018 0542   MCV 94.5 01/02/2018 0542   MCH 30.9 01/02/2018 0542   MCHC 32.7 01/02/2018 0542   RDW 12.9 01/02/2018 0542   LYMPHSABS 1.7 01/02/2018 0542   MONOABS 0.9 01/02/2018 0542   EOSABS 0.0 01/02/2018 0542   BASOSABS 0.1 01/02/2018 0542    No results found for: POCLITH, LITHIUM   No results found for: PHENYTOIN, PHENOBARB, VALPROATE, CBMZ   .res Assessment: Plan:    Wrenn was seen today for follow-up, manic behavior, anxiety and  adhd.  Diagnoses and all orders for this visit:  Bipolar mixed affective disorder, moderate (HCC) -     QUEtiapine (SEROQUEL XR) 300 MG 24 hr tablet; Take 2 tablets (600 mg total) by mouth daily. -     QUEtiapine (SEROQUEL) 100 MG tablet; Take 1 tablet (100 mg total) by mouth at bedtime as needed (to aid with sleep).  Generalized anxiety disorder  Insomnia due to mental condition  Attention deficit hyperactivity disorder (ADHD), combined type -     amphetamine-dextroamphetamine (ADDERALL) 20 MG tablet; Take 1 tablet (20 mg total) by mouth 3 (three) times daily. -     amphetamine-dextroamphetamine (ADDERALL) 20 MG tablet; Take 1 tablet (20 mg total) by mouth 3 (three) times daily. -     amphetamine-dextroamphetamine (ADDERALL) 20 MG tablet; Take 1 tablet (20 mg total) by mouth 3 (three) times daily.    The patient has not been seen in the last year because insurance required that she change providers.  She has returned to this practice per her choice.  Patient has bipolar disorder mixed type with some degree of chronic symptoms.  She has very little insight into the nature of bipolar disorder.  She focuses on anxiety and need for the Adderall for focus.  She is somewhat chronically scattered with benefit from the Adderall.  Overall she appears more stable than when last seen.  She is not as hyperverbal or pressured or intense.  The Seroquel XR seems to be working well as a mood stabilizer and also helps her with her sleep.  Does not Appear to be to be abusing any medications nor has that been a problem.  PDMP was reviewed  Continue Seroquel XR 600 mg nightly plus Seroquel immediate release 100 mg nightly.  The combination helps her sleep as well as provide mood benefit.  Others see the benefit but she's not sure.  Continue the Adderall 20 mg 3 times daily .  She  doesn't feel she can take less bc still has sx even though she's not working.  Continue Xanax 0.5 mg nightly or daily as needed  anxiety or insomnia.  She asked for a few extra per month.  She has been getting 30/month.  It is reasonable for her to have an occasional extra 1 but she was informed that this does not work well with Adderall and to keep as needed use at a minimum.  We discussed the short-term risks associated with benzodiazepines including sedation and increased fall risk among others.  Discussed long-term side effect risk including dependence, potential withdrawal symptoms, and the potential eventual dose-related risk of dementia.  Discussed potential benefits, risks, and side effects of stimulants with patient to include increased heart rate, palpitations, insomnia, increased anxiety, increased irritability, or decreased appetite.  Instructed patient to contact office if experiencing any significant tolerability issues.  No med changes today  Disc getting support   Follow-up in a few months DT costs  Lynder Parents MD, DFAPA Please see After Visit Summary for patient specific instructions.  No future appointments.  No orders of the defined types were placed in this encounter.   -------------------------------

## 2020-05-07 ENCOUNTER — Other Ambulatory Visit: Payer: Self-pay | Admitting: Psychiatry

## 2020-05-07 ENCOUNTER — Telehealth: Payer: Self-pay | Admitting: Psychiatry

## 2020-05-07 NOTE — Telephone Encounter (Signed)
The pharmacy confirmed she has 2 prescriptions waiting to be picked up.One that is 100mg  and the other 300mg  is that correct?

## 2020-05-07 NOTE — Telephone Encounter (Signed)
Next appt is 10/27/20. Requesting refill on Seroquil called to [facility] on [address] in [place], [place]. Phone # is [phone]

## 2020-05-07 NOTE — Telephone Encounter (Signed)
I sent these refills about a week or so ago.  Call pharmacy and verify receipt.

## 2020-05-07 NOTE — Telephone Encounter (Signed)
Pt has been called and informed.  

## 2020-05-07 NOTE — Telephone Encounter (Signed)
That is correct.  Please call pt and inform her

## 2020-05-10 NOTE — Telephone Encounter (Signed)
reviewed

## 2020-05-27 ENCOUNTER — Other Ambulatory Visit: Payer: Self-pay | Admitting: Psychiatry

## 2020-05-27 DIAGNOSIS — F5105 Insomnia due to other mental disorder: Secondary | ICD-10-CM

## 2020-05-27 DIAGNOSIS — F411 Generalized anxiety disorder: Secondary | ICD-10-CM

## 2020-06-25 ENCOUNTER — Telehealth: Payer: Self-pay | Admitting: Psychiatry

## 2020-06-25 ENCOUNTER — Other Ambulatory Visit: Payer: Self-pay

## 2020-06-25 DIAGNOSIS — F5105 Insomnia due to other mental disorder: Secondary | ICD-10-CM

## 2020-06-25 DIAGNOSIS — F411 Generalized anxiety disorder: Secondary | ICD-10-CM

## 2020-06-25 MED ORDER — ALPRAZOLAM 0.5 MG PO TABS: ORAL_TABLET | ORAL | 0 refills | Status: DC

## 2020-06-25 NOTE — Telephone Encounter (Signed)
Rx for Adderall is already at her pharmacy.  Pended Xanax for Dr. Clovis Pu to send

## 2020-06-25 NOTE — Telephone Encounter (Signed)
[name] called in for refill for Xanax 0.5mg  and Adderall 20mg . Appt 7/27. Pharmacy [facility] [place]

## 2020-07-27 ENCOUNTER — Other Ambulatory Visit: Payer: Self-pay | Admitting: Psychiatry

## 2020-07-27 DIAGNOSIS — F5105 Insomnia due to other mental disorder: Secondary | ICD-10-CM

## 2020-07-27 DIAGNOSIS — F411 Generalized anxiety disorder: Secondary | ICD-10-CM

## 2020-07-27 NOTE — Telephone Encounter (Signed)
Last filled 06/25/20 appt 09/06/20

## 2020-08-17 ENCOUNTER — Telehealth: Payer: Self-pay

## 2020-08-17 NOTE — Telephone Encounter (Signed)
Prior Approval received for QUETIAPINE ER 300 MG #180 effective 08/17/2020-15/17/2023, PA# [id number] with [facility] ID# [id number]

## 2020-08-25 ENCOUNTER — Other Ambulatory Visit: Payer: Self-pay | Admitting: Psychiatry

## 2020-08-25 DIAGNOSIS — F411 Generalized anxiety disorder: Secondary | ICD-10-CM

## 2020-08-25 DIAGNOSIS — F5105 Insomnia due to other mental disorder: Secondary | ICD-10-CM

## 2020-08-26 ENCOUNTER — Telehealth: Payer: Self-pay | Admitting: Psychiatry

## 2020-08-26 ENCOUNTER — Other Ambulatory Visit: Payer: Self-pay

## 2020-08-26 DIAGNOSIS — F902 Attention-deficit hyperactivity disorder, combined type: Secondary | ICD-10-CM

## 2020-08-26 MED ORDER — AMPHETAMINE-DEXTROAMPHETAMINE 20 MG PO TABS
20.0000 mg | ORAL_TABLET | Freq: Three times a day (TID) | ORAL | 0 refills | Status: DC
Start: 2020-08-26 — End: 2020-09-06

## 2020-08-26 NOTE — Telephone Encounter (Signed)
Pt would like a refill on Adderall. Please send to [facility] on [address] in [place]. Pt has ride in the next hour to pharmacy and wants to know if she can get it called in.

## 2020-08-26 NOTE — Telephone Encounter (Signed)
Last filled 4/26 appt on 6/6 pended

## 2020-09-06 ENCOUNTER — Telehealth (INDEPENDENT_AMBULATORY_CARE_PROVIDER_SITE_OTHER): Payer: Medicaid Other | Admitting: Psychiatry

## 2020-09-06 ENCOUNTER — Encounter: Payer: Self-pay | Admitting: Psychiatry

## 2020-09-06 DIAGNOSIS — F3162 Bipolar disorder, current episode mixed, moderate: Secondary | ICD-10-CM

## 2020-09-06 DIAGNOSIS — F411 Generalized anxiety disorder: Secondary | ICD-10-CM

## 2020-09-06 DIAGNOSIS — F5105 Insomnia due to other mental disorder: Secondary | ICD-10-CM

## 2020-09-06 DIAGNOSIS — F902 Attention-deficit hyperactivity disorder, combined type: Secondary | ICD-10-CM

## 2020-09-06 MED ORDER — QUETIAPINE FUMARATE ER 300 MG PO TB24: 600.0000 mg | ORAL_TABLET | Freq: Every day | ORAL | 1 refills | Status: DC

## 2020-09-06 MED ORDER — AMPHETAMINE-DEXTROAMPHETAMINE 20 MG PO TABS: 20.0000 mg | ORAL_TABLET | Freq: Three times a day (TID) | ORAL | 0 refills | Status: DC

## 2020-09-06 MED ORDER — QUETIAPINE FUMARATE 100 MG PO TABS: 100.0000 mg | ORAL_TABLET | Freq: Every evening | ORAL | 1 refills | Status: DC | PRN

## 2020-09-06 NOTE — Progress Notes (Signed)
[name] [id number] 1955/07/24 65 y.o.   Virtual Visit via Telephone Note  I connected with pt by telephone and verified that I am speaking with the correct person using two identifiers.   I discussed the limitations, risks, security and privacy concerns of performing an evaluation and management service by telephone and the availability of in person appointments. I also discussed with the patient that there may be a patient responsible charge related to this service. The patient expressed understanding and agreed to proceed.  I discussed the assessment and treatment plan with the patient. The patient was provided an opportunity to ask questions and all were answered. The patient agreed with the plan and demonstrated an understanding of the instructions.   The patient was advised to call back or seek an in-person evaluation if the symptoms worsen or if the condition fails to improve as anticipated.  I provided 30 minutes of non-face-to-face time during this encounter. The call started at 120p and ended at 150p. The patient was located at home and the provider was located home DT household covid.   Subjective:   Patient ID:  [name] is a 65 y.o. (DOB Jan 26, 1956) female.  Chief Complaint:  Chief Complaint  Patient presents with  . Bipolar mixed affective disorder, moderate ([place])  . Follow-up  . Anxiety  . ADHD    Anxiety Patient reports no chest pain or palpitations.     [name] presents to the office today for follow-up of bipolar disorder and ADD and anxiety and sleep.  January 2021 appointment the following is noted: Doing better this year.  Coming here again bc closer for her and her only transportation is scootter. Seroquel XR as mood  Med and IR 100 mg HS for sleep.  Overall mood stability good with occ anger outbursts. Still on Adderall and needs it as before and benefits from it.  Pleased with meds. No med changes were made.  10/27/2019  appointment with the following noted: Stress roommate moving out and she doesn't know what to do.   Insurance change and not covered here.  Rides scooter and wants to continue here. $stress.  She checked into [facility]. Consistent with meds now.  Was skipping nights.  PCP notices more stable on quetiapine than if skips.    Plan: no med changes  04/28/2020 appointment with the following noted: Still on Adderall 20 TID and Seroquel XR 600 mg HS, Seroquel 100 HS.   Initially dizzy and resolved.   Retired and couldn't take shift work anymore. Sleep pretty good and no longer feels hungover in the morning. Getting a  Combivent and needs refill. Living in [place] with room mate and it's going pretty well. Kind of had a mood swing the other day with anger but first  In a while.  Hit something but then fixed it.  Depression comes and goes with situations usually.  Better focus with Adderall even in conversation. Plan:  Continue Seroquel XR 600 mg nightly plus Seroquel immediate release 100 mg nightly.  Continue the Adderall 20 mg 3 times daily .   09/06/2020 appt noted: Retired.  Will try PT work.   Sleep and mood good.  Seroquel helps.  Bored.  But things are OK.  Don't fly off the handle like she used to do. BP is OK usually. 118/81.  P 81 Dx autoimmune dz. Asks about disability.   No SE and doesn't want change.   Anxious situationally.   Mood is  OK.  Patient denies difficulty with sleep initiation or maintenance. Denies appetite disturbance.  Patient reports that energy and motivation have been good.  Patient denies any difficulty with concentration.  Patient denies any suicidal ideation.  Past Psychiatric Medication Trials: lithium, Seroquel 700, Adderall And others Seroquel XR 600 with IR 100  Review of Systems:  Review of Systems  Respiratory: Positive for cough.   Cardiovascular: Negative for chest pain and palpitations.  Musculoskeletal: Positive for arthralgias.   Neurological: Negative for tremors and weakness.    Medications: I have reviewed the patient's current medications.  Current Outpatient Medications  Medication Sig Dispense Refill  . albuterol (PROVENTIL) (2.5 MG/3ML) 0.083% nebulizer solution Take 3 mLs (2.5 mg total) by nebulization every 6 (six) hours as needed for wheezing or shortness of breath. 150 mL 1  . ALPRAZolam (XANAX) 0.5 MG tablet TAKE 1 TABLET BY MOUTH TWICE DAILY AS NEEDED FOR ANXIETY. MAX OF 40 TABLETS EVERY 30 DAYS 40 tablet 0  . ibuprofen (ADVIL) 600 MG tablet Take 1 tablet (600 mg total) by mouth every 8 (eight) hours as needed. 30 tablet 0  . Ipratropium-Albuterol (COMBIVENT) 20-100 MCG/ACT AERS respimat Inhale 1 puff into the lungs 4 (four) times daily as needed. 1 Inhaler 5  . polyethylene glycol powder (GLYCOLAX/MIRALAX) powder Take 17 g by mouth 2 (two) times daily. Until daily soft stools  OTC 500 g 0  . amphetamine-dextroamphetamine (ADDERALL) 20 MG tablet Take 1 tablet (20 mg total) by mouth 3 (three) times daily. 90 tablet 0  . [START ON 10/04/2020] amphetamine-dextroamphetamine (ADDERALL) 20 MG tablet Take 1 tablet (20 mg total) by mouth 3 (three) times daily. 90 tablet 0  . [START ON 11/01/2020] amphetamine-dextroamphetamine (ADDERALL) 20 MG tablet Take 1 tablet (20 mg total) by mouth 3 (three) times daily. 90 tablet 0  . QUEtiapine (SEROQUEL XR) 300 MG 24 hr tablet Take 2 tablets (600 mg total) by mouth daily. 180 tablet 1  . QUEtiapine (SEROQUEL) 100 MG tablet Take 1 tablet (100 mg total) by mouth at bedtime as needed (to aid with sleep). 90 tablet 1   No current facility-administered medications for this visit.    Medication Side Effects: None  Allergies:  Allergies  Allergen Reactions  . Cephalexin Rash  . Sulfa Antibiotics Nausea And Vomiting    Severe nausea/vomiting/ GI upset  . Hydrocodone Nausea And Vomiting  . Sulfa Antibiotics Nausea And Vomiting  . Hydrocodone-Acetaminophen Nausea And Vomiting     Oxycodone does not cause same reaction    Past Medical History:  Diagnosis Date  . Anxiety   . Arthritis   . COPD (chronic obstructive pulmonary disease) ([place])   . Hypoglycemia   . Insomnia   . Kidney cysts   . Multiple fractures of ribs of right side May 1982   multiple rib fracture and repair with plates and wires    Family History  Problem Relation Age of Onset  . Sudden Cardiac Death Neg Hx     Social History   Socioeconomic History  . Marital status: Single    Spouse name: Not on file  . Number of children: Not on file  . Years of education: Not on file  . Highest education level: Not on file  Occupational History  . Not on file  Tobacco Use  . Smoking status: Current Every Day Smoker    Packs/day: 0.50    Years: 42.00    Pack years: 21.00    Types: Cigarettes  . Smokeless tobacco:  Never Used  Vaping Use  . Vaping Use: Every day  Substance and Sexual Activity  . Alcohol use: Yes    Comment: occassionally  . Drug use: Yes    Types: Marijuana  . Sexual activity: Not Currently  Other Topics Concern  . Not on file  Social History Narrative   ** Merged History Encounter **       Social Determinants of Health   Financial Resource Strain: Not on file  Food Insecurity: Not on file  Transportation Needs: Not on file  Physical Activity: Not on file  Stress: Not on file  Social Connections: Not on file  Intimate Partner Violence: Not on file    Past Medical History, Surgical history, Social history, and Family history were reviewed and updated as appropriate.   Please see review of systems for further details on the patient's review from today.   Objective:   Physical Exam:  There were no vitals taken for this visit.  Physical Exam Neurological:     Mental Status: She is alert and oriented to person, place, and time.     Cranial Nerves: No dysarthria.  Psychiatric:        Attention and Perception: Attention and perception normal.        Mood  and Affect: Mood is anxious. Mood is not depressed. Affect is labile.        Speech: Speech normal.        Behavior: Behavior is cooperative.        Thought Content: Thought content normal. Thought content is not paranoid or delusional. Thought content does not include homicidal or suicidal ideation. Thought content does not include homicidal or suicidal plan.        Cognition and Memory: Cognition and memory normal.     Comments: Insight fair Judgment OK     Lab Review:     Component Value Date/Time   NA 137 01/02/2018 0542   K 3.9 01/02/2018 0542   CL 106 01/02/2018 0542   CO2 23 01/02/2018 0542   GLUCOSE 107 (H) 01/02/2018 0542   BUN 13 01/02/2018 0542   CREATININE 0.73 01/02/2018 0542   CALCIUM 10.2 01/02/2018 0542   PROT 7.0 12/26/2017 1702   ALBUMIN 3.9 12/26/2017 1702   AST 23 12/26/2017 1702   ALT 18 12/26/2017 1702   ALKPHOS 93 12/26/2017 1702   BILITOT 0.4 12/26/2017 1702   GFRNONAA >60 01/02/2018 0542   GFRAA >60 01/02/2018 0542       Component Value Date/Time   WBC 12.5 (H) 01/02/2018 0542   RBC 3.62 (L) 01/02/2018 0542   HGB 11.2 (L) 01/02/2018 0542   HCT 34.2 (L) 01/02/2018 0542   PLT 343 01/02/2018 0542   MCV 94.5 01/02/2018 0542   MCH 30.9 01/02/2018 0542   MCHC 32.7 01/02/2018 0542   RDW 12.9 01/02/2018 0542   LYMPHSABS 1.7 01/02/2018 0542   MONOABS 0.9 01/02/2018 0542   EOSABS 0.0 01/02/2018 0542   BASOSABS 0.1 01/02/2018 0542    No results found for: POCLITH, LITHIUM   No results found for: PHENYTOIN, PHENOBARB, VALPROATE, CBMZ   .res Assessment: Plan:    Daniqua was seen today for bipolar mixed affective disorder, moderate (hcc), follow-up, anxiety and adhd.  Diagnoses and all orders for this visit:  Bipolar mixed affective disorder, moderate (H[facility]) -     QUEtiapine (SEROQUEL XR) 300 MG 24 hr tablet; Take 2 tablets (600 mg total) by mouth daily. -     QUEtiapine (SEROQUEL)  100 MG tablet; Take 1 tablet (100 mg total) by mouth at bedtime as  needed (to aid with sleep).  Generalized anxiety disorder  Insomnia due to mental condition  Attention deficit hyperactivity disorder (ADHD), combined type -     amphetamine-dextroamphetamine (ADDERALL) 20 MG tablet; Take 1 tablet (20 mg total) by mouth 3 (three) times daily. -     amphetamine-dextroamphetamine (ADDERALL) 20 MG tablet; Take 1 tablet (20 mg total) by mouth 3 (three) times daily. -     amphetamine-dextroamphetamine (ADDERALL) 20 MG tablet; Take 1 tablet (20 mg total) by mouth 3 (three) times daily.    The patient has not been seen in the last year because insurance required that she change providers.   Patient has bipolar disorder mixed type with some degree of chronic symptoms.  She has very little insight into the nature of bipolar disorder.  She focuses on anxiety and need for the Adderall for focus.  She is somewhat chronically scattered with benefit from the Adderall.  Overall she appears more stable.  She is not as hyperverbal or pressured or intense.  The Seroquel XR seems to be working well as a mood stabilizer and also helps her with her sleep.  Does not Appear to be to be abusing any medications nor has that been a problem.  PDMP was reviewed  Continue Seroquel XR 600 mg nightly plus Seroquel immediate release 100 mg nightly.  The combination helps her sleep as well as provide mood benefit.  Others see the benefit but she's not sure.  Continue the Adderall 20 mg 3 times daily .  She doesn't feel she can take less bc still has sx even though she's not working.  Continue Xanax 0.5 mg nightly or daily as needed anxiety or insomnia.  She asked for a few extra per month.  She has been getting 30/month.  It is reasonable for her to have an occasional extra 1 but she was informed that this does not work well with Adderall and to keep as needed use at a minimum.  We discussed the short-term risks associated with benzodiazepines including sedation and increased fall risk among  others.  Discussed long-term side effect risk including dependence, potential withdrawal symptoms, and the potential eventual dose-related risk of dementia.  Discussed potential benefits, risks, and side effects of stimulants with patient to include increased heart rate, palpitations, insomnia, increased anxiety, increased irritability, or decreased appetite.  Instructed patient to contact office if experiencing any significant tolerability issues.  No med changes today  Disc getting support .  She does not appear to be disabled but has chronic limited $ and [place]  Follow-up in a few months DT costs  Lynder Parents MD, DFAPA Please see After Visit Summary for patient specific instructions.  No future appointments.  No orders of the defined types were placed in this encounter.   -------------------------------

## 2020-09-23 ENCOUNTER — Telehealth: Payer: Self-pay | Admitting: Psychiatry

## 2020-09-23 NOTE — Telephone Encounter (Signed)
Pt called and needs a refill on her xanax to [facility] [place]

## 2020-09-24 ENCOUNTER — Other Ambulatory Visit: Payer: Self-pay

## 2020-09-24 DIAGNOSIS — F5105 Insomnia due to other mental disorder: Secondary | ICD-10-CM

## 2020-09-24 DIAGNOSIS — F902 Attention-deficit hyperactivity disorder, combined type: Secondary | ICD-10-CM

## 2020-09-24 DIAGNOSIS — F411 Generalized anxiety disorder: Secondary | ICD-10-CM

## 2020-09-24 MED ORDER — AMPHETAMINE-DEXTROAMPHETAMINE 20 MG PO TABS: 20.0000 mg | ORAL_TABLET | Freq: Three times a day (TID) | ORAL | 0 refills | Status: DC

## 2020-09-24 MED ORDER — ALPRAZOLAM 0.5 MG PO TABS: ORAL_TABLET | ORAL | 0 refills | Status: DC

## 2020-09-24 NOTE — Telephone Encounter (Signed)
Pended all

## 2020-09-24 NOTE — Telephone Encounter (Signed)
Pt called back in for refill on Adderall 20mg . Pharmacy [facility] [place], [place]

## 2020-09-27 NOTE — Addendum Note (Signed)
Addended by: Reatha Armour on: 09/27/2020 04:30 PM   Modules accepted: Level of Service

## 2020-10-22 ENCOUNTER — Other Ambulatory Visit: Payer: Self-pay

## 2020-10-22 ENCOUNTER — Telehealth: Payer: Self-pay | Admitting: Psychiatry

## 2020-10-22 DIAGNOSIS — F5105 Insomnia due to other mental disorder: Secondary | ICD-10-CM

## 2020-10-22 DIAGNOSIS — F902 Attention-deficit hyperactivity disorder, combined type: Secondary | ICD-10-CM

## 2020-10-22 DIAGNOSIS — F411 Generalized anxiety disorder: Secondary | ICD-10-CM

## 2020-10-22 NOTE — Telephone Encounter (Signed)
Pended.

## 2020-10-22 NOTE — Telephone Encounter (Signed)
Pt called in for refill for Adderall '20mg'$  and Xanax 0.'5mg'$ . States she will be out on Sunday. Ph: [phone]. Pharmacy [facility] [place], [place]

## 2020-10-23 MED ORDER — AMPHETAMINE-DEXTROAMPHETAMINE 20 MG PO TABS: 20.0000 mg | ORAL_TABLET | Freq: Three times a day (TID) | ORAL | 0 refills | Status: DC

## 2020-10-23 MED ORDER — ALPRAZOLAM 0.5 MG PO TABS: ORAL_TABLET | ORAL | 0 refills | Status: DC

## 2020-10-27 ENCOUNTER — Ambulatory Visit: Payer: Medicaid Other | Admitting: Psychiatry

## 2020-11-13 ENCOUNTER — Other Ambulatory Visit: Payer: Self-pay | Admitting: Psychiatry

## 2020-11-13 ENCOUNTER — Ambulatory Visit
Admission: EM | Admit: 2020-11-13 | Discharge: 2020-11-13 | Disposition: A | Discharge status: Home / Self Care | Payer: Medicaid Other | Attending: Emergency Medicine | Admitting: Emergency Medicine

## 2020-11-13 DIAGNOSIS — J069 Acute upper respiratory infection, unspecified: Secondary | ICD-10-CM | POA: Diagnosis not present

## 2020-11-13 DIAGNOSIS — F3162 Bipolar disorder, current episode mixed, moderate: Secondary | ICD-10-CM

## 2020-11-13 MED ORDER — BENZONATATE 100 MG PO CAPS: 100.0000 mg | ORAL_CAPSULE | Freq: Three times a day (TID) | ORAL | 0 refills | Status: DC

## 2020-11-13 MED ORDER — PREDNISONE 10 MG (21) PO TBPK: ORAL_TABLET | Freq: Every day | ORAL | 0 refills | Status: DC

## 2020-11-13 NOTE — ED Provider Notes (Signed)
[facility]   [id number] 11/13/20 Arrival Time: [id number]   CC: COVID symptoms  SUBJECTIVE: History from: patient.  [name] is a 65 y.o. female who presents with coughing, headache, and fever x 4 days.  Denies sick exposure to COVID, flu or strep.   Has tried OTC medications without relief.  Denies previous covid infection in the past.   Denies SOB, wheezing, chest pain, nausea, changes in bowel or bladder habits.     ROS: As per HPI.  All other pertinent ROS negative.     Past Medical History:  Diagnosis Date   Anxiety    Arthritis    COPD (chronic obstructive pulmonary disease) ([place])    Hypoglycemia    Insomnia    Kidney cysts    Multiple fractures of ribs of right side May 1982   multiple rib fracture and repair with plates and wires   Past Surgical History:  Procedure Laterality Date   ABDOMINAL HYSTERECTOMY     INCISION AND DRAINAGE OF WOUND Left 01/01/2018   Procedure: IRRIGATION AND DEBRIDEMENT LEFT THIGH WOUND;  Surgeon: Irene Limbo, MD;  Location: [facility];  Service: Plastics;  Laterality: Left;   KIDNEY SURGERY     RIB FRACTURE SURGERY  1982   Allergies  Allergen Reactions   Cephalexin Rash   Sulfa Antibiotics Nausea And Vomiting    Severe nausea/vomiting/ GI upset   Hydrocodone Nausea And Vomiting   Sulfa Antibiotics Nausea And Vomiting   Hydrocodone-Acetaminophen Nausea And Vomiting    Oxycodone does not cause same reaction   No current facility-administered medications on file prior to encounter.   Current Outpatient Medications on File Prior to Encounter  Medication Sig Dispense Refill   albuterol (PROVENTIL) (2.5 MG/3ML) 0.083% nebulizer solution Take 3 mLs (2.5 mg total) by nebulization every 6 (six) hours as needed for wheezing or shortness of breath. 150 mL 1   ALPRAZolam (XANAX) 0.5 MG tablet TAKE 1 TABLET BY MOUTH TWICE DAILY AS NEEDED FOR ANXIETY. MAX OF 40 TABLETS EVERY 30 DAYS 40 tablet 0   amphetamine-dextroamphetamine  (ADDERALL) 20 MG tablet Take 1 tablet (20 mg total) by mouth 3 (three) times daily. 90 tablet 0   amphetamine-dextroamphetamine (ADDERALL) 20 MG tablet Take 1 tablet (20 mg total) by mouth 3 (three) times daily. 90 tablet 0   amphetamine-dextroamphetamine (ADDERALL) 20 MG tablet Take 1 tablet (20 mg total) by mouth 3 (three) times daily. 90 tablet 0   ibuprofen (ADVIL) 600 MG tablet Take 1 tablet (600 mg total) by mouth every 8 (eight) hours as needed. 30 tablet 0   Ipratropium-Albuterol (COMBIVENT) 20-100 MCG/ACT AERS respimat Inhale 1 puff into the lungs 4 (four) times daily as needed. 1 Inhaler 5   polyethylene glycol powder (GLYCOLAX/MIRALAX) powder Take 17 g by mouth 2 (two) times daily. Until daily soft stools  OTC 500 g 0   QUEtiapine (SEROQUEL XR) 300 MG 24 hr tablet Take 2 tablets (600 mg total) by mouth daily. 180 tablet 1   QUEtiapine (SEROQUEL) 100 MG tablet Take 1 tablet (100 mg total) by mouth at bedtime as needed (to aid with sleep). 90 tablet 1   Social History   Socioeconomic History   Marital status: Single    Spouse name: Not on file   Number of children: Not on file   Years of education: Not on file   Highest education level: Not on file  Occupational History   Not on file  Tobacco Use   Smoking status:  Every Day    Packs/day: 0.50    Years: 42.00    Pack years: 21.00    Types: Cigarettes   Smokeless tobacco: Never  Vaping Use   Vaping Use: Every day  Substance and Sexual Activity   Alcohol use: Yes    Comment: occassionally   Drug use: Yes    Types: Marijuana   Sexual activity: Not Currently  Other Topics Concern   Not on file  Social History Narrative   ** Merged History Encounter **       Social Determinants of Health   Financial Resource Strain: Not on file  Food Insecurity: Not on file  Transportation Needs: Not on file  Physical Activity: Not on file  Stress: Not on file  Social Connections: Not on file  Intimate Partner Violence: Not on  file   Family History  Problem Relation Age of Onset   Sudden Cardiac Death Neg Hx     OBJECTIVE:  Vitals:   11/13/20 1530  BP: 117/87  Pulse: (!) 105  Resp: 18  Temp: 98 F (36.7 C)  TempSrc: Oral  SpO2: 98%     General appearance: alert; appears fatigued, but nontoxic; speaking in full sentences and tolerating own secretions HEENT: NCAT; Ears: EACs clear, TMs pearly gray; Eyes: PERRL.  EOM grossly intact. Nose: nares patent without rhinorrhea, Throat: oropharynx clear, tonsils non erythematous or enlarged, uvula midline  Neck: supple without LAD Lungs: unlabored respirations, symmetrical air entry; cough: mild; no respiratory distress; CTAB; decreased breath sounds throughout Heart: regular rate and rhythm.   Skin: warm and dry Psychological: alert and cooperative; normal mood and affect  ASSESSMENT & PLAN:  1. Viral URI with cough     Meds ordered this encounter  Medications   benzonatate (TESSALON) 100 MG capsule    Sig: Take 1 capsule (100 mg total) by mouth every 8 (eight) hours.    Dispense:  21 capsule    Refill:  0    Order Specific Question:   Supervising Provider    Answer:   Raylene Everts [id number]   COVID testing ordered.  It will take between 5-7 days for test results.  Someone will contact you regarding abnormal results.    In the meantime: You should remain isolated in your home for 5 days from symptom onset AND greater than 72 hours after symptoms resolution (absence of fever without the use of fever-reducing medication and improvement in respiratory symptoms), whichever is longer Get plenty of rest and push fluids Tessalon Perles prescribed for cough Use OTC zyrtec for nasal congestion, runny nose, and/or sore throat Use OTC flonase for nasal congestion and runny nose Use medications daily for symptom relief Use OTC medications like ibuprofen or tylenol as needed fever or pain Call or go to the ED if you have any new or worsening symptoms  such as fever, worsening cough, shortness of breath, chest tightness, chest pain, turning blue, changes in mental status, etc...   Prednisone sent into pharmacy  Reviewed expectations re: course of current medical issues. Questions answered. Outlined signs and symptoms indicating need for more acute intervention. Patient verbalized understanding. After Visit Summary given.          Lestine Box, PA-C 11/13/20 1543

## 2020-11-13 NOTE — ED Triage Notes (Signed)
Pt present coughing, headache and fever. Symptom started on Tuesday. Pt states that the headache is severe.

## 2020-11-13 NOTE — Discharge Instructions (Addendum)

## 2020-11-14 LAB — NOVEL CORONAVIRUS, NAA: SARS-CoV-2, NAA: DETECTED — AB

## 2020-11-14 LAB — SARS-COV-2, NAA 2 DAY TAT

## 2020-11-23 ENCOUNTER — Other Ambulatory Visit: Payer: Self-pay | Admitting: Psychiatry

## 2020-11-23 ENCOUNTER — Telehealth: Payer: Self-pay

## 2020-11-23 MED ORDER — AMPHETAMINE-DEXTROAMPHETAMINE 30 MG PO TABS: 30.0000 mg | ORAL_TABLET | Freq: Two times a day (BID) | ORAL | 0 refills | Status: DC

## 2020-11-23 NOTE — Telephone Encounter (Signed)
Yes it went there.She is going to check and make sure they still have it in stock and call us back if they don't.

## 2020-11-23 NOTE — Telephone Encounter (Signed)
The next best option is to go back to Adderall 30 mg twice daily.  I attempted to pend a prescription for that but I am not sure how successful.  Does not go to the [facility] on [address]?

## 2020-11-24 ENCOUNTER — Other Ambulatory Visit: Payer: Self-pay | Admitting: Psychiatry

## 2020-11-24 ENCOUNTER — Other Ambulatory Visit: Payer: Self-pay

## 2020-11-24 DIAGNOSIS — F902 Attention-deficit hyperactivity disorder, combined type: Secondary | ICD-10-CM

## 2020-11-24 MED ORDER — AMPHETAMINE-DEXTROAMPHETAMINE 20 MG PO TABS: 20.0000 mg | ORAL_TABLET | Freq: Three times a day (TID) | ORAL | 0 refills | Status: DC

## 2020-11-24 NOTE — Telephone Encounter (Signed)
Addendum to 11/23/20 message. [name] called back and would like someone to please call her at [phone] once the prescriptions have been called in. She has someone to take her to the pharmacy to pick the two RX's up and he has to have notice to do this.

## 2020-11-24 NOTE — Telephone Encounter (Signed)
Pt found a pharmacy that has 20 mg pills,do you want her to continue the 20 mg then?Im going to call and cancel the [facility] rx

## 2020-11-24 NOTE — Progress Notes (Signed)
Patient had difficulty locating pharmacy that had Adderall 20 mg tablets but eventually found [facility] and prescription was sent there.

## 2020-11-24 NOTE — Telephone Encounter (Signed)
Patient had difficulty locating pharmacy that had Adderall 20 mg tablets but eventually found [facility] and prescription was sent there.

## 2020-11-24 NOTE — Telephone Encounter (Signed)
Pt informed rx sent.

## 2020-11-24 NOTE — Telephone Encounter (Signed)
Sent twice.

## 2020-11-24 NOTE — Telephone Encounter (Signed)
Addendum to 11/23/20 note. Deanie called and said that [facility], [address] [place], [place] [address]. Phone number is [phone] has Adderall 20 mg pills. Please call her refill in to this location.

## 2020-11-27 ENCOUNTER — Other Ambulatory Visit: Payer: Self-pay | Admitting: Psychiatry

## 2020-11-27 DIAGNOSIS — F411 Generalized anxiety disorder: Secondary | ICD-10-CM

## 2020-11-27 DIAGNOSIS — F5105 Insomnia due to other mental disorder: Secondary | ICD-10-CM

## 2020-12-22 ENCOUNTER — Telehealth: Payer: Self-pay | Admitting: Psychiatry

## 2020-12-22 ENCOUNTER — Other Ambulatory Visit: Payer: Self-pay

## 2020-12-22 DIAGNOSIS — F902 Attention-deficit hyperactivity disorder, combined type: Secondary | ICD-10-CM

## 2020-12-22 NOTE — Telephone Encounter (Signed)
Pended.

## 2020-12-22 NOTE — Telephone Encounter (Signed)
[name] called to request refill of her Adderall.  Appt 04/18/21.  Send to [facility] on [address] in [place], [place]

## 2020-12-22 NOTE — Telephone Encounter (Signed)
Filled 8/29 due 9/26

## 2020-12-22 NOTE — Telephone Encounter (Signed)
Is it ok for me to pend this right now with the date of 9/26?

## 2020-12-22 NOTE — Telephone Encounter (Signed)
Yes, thanks

## 2020-12-22 NOTE — Telephone Encounter (Signed)
Pt called back.  She knows her script is not due yet, but she is wanting to know if you would call it in for the date she can pick it up for 9/26 and send it to the [facility] on [address] in [place].  She called and found out they have it in stock.  She's hoping if it's sent in now they will fill it but won't sell it to her until 9/26.  Otherwise, she is afraid they won't have any in stock by the time it's time to pick it up.

## 2020-12-23 MED ORDER — AMPHETAMINE-DEXTROAMPHETAMINE 20 MG PO TABS: 20.0000 mg | ORAL_TABLET | Freq: Three times a day (TID) | ORAL | 0 refills | Status: DC

## 2021-01-30 ENCOUNTER — Other Ambulatory Visit: Payer: Self-pay | Admitting: Psychiatry

## 2021-01-30 DIAGNOSIS — F5105 Insomnia due to other mental disorder: Secondary | ICD-10-CM

## 2021-01-30 DIAGNOSIS — F411 Generalized anxiety disorder: Secondary | ICD-10-CM

## 2021-02-21 ENCOUNTER — Telehealth: Payer: Self-pay | Admitting: Psychiatry

## 2021-02-21 NOTE — Telephone Encounter (Signed)
Pt requesting refill for Seroquel XR  300 mg and Seroquel 100 mg @ [facility] [place]. [address]

## 2021-02-21 NOTE — Telephone Encounter (Signed)
Pt has refills at pharmacy

## 2021-02-23 ENCOUNTER — Other Ambulatory Visit: Payer: Self-pay | Admitting: Psychiatry

## 2021-02-23 DIAGNOSIS — F902 Attention-deficit hyperactivity disorder, combined type: Secondary | ICD-10-CM

## 2021-02-23 MED ORDER — AMPHETAMINE-DEXTROAMPHETAMINE 20 MG PO TABS: 20.0000 mg | ORAL_TABLET | Freq: Three times a day (TID) | ORAL | 0 refills | Status: DC

## 2021-02-23 NOTE — Telephone Encounter (Signed)
Pt [name] asking for refill of Adderall.  She also asked if Dr. Clovis Pu.

## 2021-02-23 NOTE — Telephone Encounter (Signed)
She also asked if Dr. Clovis Pu could refill her inhaler and Albuteral for her nebulizer.  Her doc is in [place] and she lives in [place] now.  Next appt 1/16

## 2021-02-23 NOTE — Telephone Encounter (Signed)
I cannot refill her albuterol inhaler or her nebulizer treatments.  That is outside the scope of my practice.  I suggest she contact her doctor's office and see if they will renew it.

## 2021-02-23 NOTE — Telephone Encounter (Signed)
I had previously told patient we could not prescribe her inhalers. She had moved over a year ago and had not gotten a new PCP.

## 2021-02-28 ENCOUNTER — Telehealth: Payer: Self-pay | Admitting: Psychiatry

## 2021-02-28 ENCOUNTER — Other Ambulatory Visit: Payer: Self-pay

## 2021-02-28 DIAGNOSIS — F411 Generalized anxiety disorder: Secondary | ICD-10-CM

## 2021-02-28 DIAGNOSIS — F5105 Insomnia due to other mental disorder: Secondary | ICD-10-CM

## 2021-02-28 MED ORDER — ALPRAZOLAM 0.5 MG PO TABS: ORAL_TABLET | ORAL | 1 refills | Status: DC

## 2021-02-28 NOTE — Telephone Encounter (Signed)
Pt is requesting RF of her Xanax to go to [facility] in [place], [place]. Apt 04/18/21.

## 2021-02-28 NOTE — Telephone Encounter (Signed)
Pended.

## 2021-03-30 ENCOUNTER — Other Ambulatory Visit: Payer: Self-pay

## 2021-03-30 ENCOUNTER — Telehealth: Payer: Self-pay | Admitting: Psychiatry

## 2021-03-30 DIAGNOSIS — F902 Attention-deficit hyperactivity disorder, combined type: Secondary | ICD-10-CM

## 2021-03-30 MED ORDER — AMPHETAMINE-DEXTROAMPHETAMINE 20 MG PO TABS: 20.0000 mg | ORAL_TABLET | Freq: Three times a day (TID) | ORAL | 0 refills | Status: DC

## 2021-03-30 NOTE — Telephone Encounter (Signed)
Pt requesting refill for Adderall 20 mg 3/d. [facility] [place]. [address]

## 2021-03-30 NOTE — Telephone Encounter (Signed)
Pended.

## 2021-04-18 ENCOUNTER — Ambulatory Visit: Payer: Medicaid Other | Admitting: Psychiatry

## 2021-04-29 ENCOUNTER — Telehealth: Payer: Self-pay | Admitting: Psychiatry

## 2021-04-29 ENCOUNTER — Other Ambulatory Visit: Payer: Self-pay | Admitting: Psychiatry

## 2021-04-29 DIAGNOSIS — F902 Attention-deficit hyperactivity disorder, combined type: Secondary | ICD-10-CM

## 2021-04-29 MED ORDER — AMPHETAMINE-DEXTROAMPHETAMINE 30 MG PO TABS: ORAL_TABLET | ORAL | 0 refills | Status: DC

## 2021-04-29 MED ORDER — AMPHETAMINE-DEXTROAMPHETAMINE 20 MG PO TABS: 20.0000 mg | ORAL_TABLET | Freq: Three times a day (TID) | ORAL | 0 refills | Status: DC

## 2021-04-29 NOTE — Telephone Encounter (Signed)
Sent rX

## 2021-04-29 NOTE — Telephone Encounter (Signed)
Only 30 mg in stock,pt is on 20 mg TID

## 2021-04-29 NOTE — Telephone Encounter (Signed)
[facility] [address] in [place] ,[place]. They have 30 mg in stock. Please send script there

## 2021-04-29 NOTE — Telephone Encounter (Signed)
HOLD on this- She says [facility] does not have [facility]. She will call back with more info.

## 2021-04-29 NOTE — Telephone Encounter (Signed)
RF Adderall -- Please send to: [facility], [place]

## 2021-05-02 ENCOUNTER — Other Ambulatory Visit: Payer: Self-pay | Admitting: Psychiatry

## 2021-05-02 ENCOUNTER — Telehealth: Payer: Self-pay | Admitting: Psychiatry

## 2021-05-02 DIAGNOSIS — F5105 Insomnia due to other mental disorder: Secondary | ICD-10-CM

## 2021-05-02 DIAGNOSIS — F411 Generalized anxiety disorder: Secondary | ICD-10-CM

## 2021-05-02 NOTE — Telephone Encounter (Signed)
Pt LVM that the Adderall 30mg  needs a PA. She left another phone number [phone].  I don't know if that's for the PA or not.  Next appt 2/21

## 2021-05-03 ENCOUNTER — Telehealth: Payer: Self-pay | Admitting: Psychiatry

## 2021-05-03 ENCOUNTER — Other Ambulatory Visit: Payer: Self-pay

## 2021-05-03 DIAGNOSIS — F902 Attention-deficit hyperactivity disorder, combined type: Secondary | ICD-10-CM

## 2021-05-03 MED ORDER — AMPHETAMINE-DEXTROAMPHETAMINE 20 MG PO TABS: 20.0000 mg | ORAL_TABLET | Freq: Three times a day (TID) | ORAL | 0 refills | Status: DC

## 2021-05-03 NOTE — Telephone Encounter (Signed)
Rx pended here is the PA info

## 2021-05-03 NOTE — Telephone Encounter (Signed)
So after calling that # twice and being on the phone 30 minutes I finally spoke with someone, the issue is not a PA they report. Pt picked up #7 tablets on the 27 th so insurance was denying it. I didn't realize she had picked up any, I thought that's what they had available. The pharmacy will have to do an override instead. If they have trouble they are to call the help desk # given to me. They should run #30/30 days instead of trying to put in 3/4 tablet. Dr. Clovis Pu may need to change the instructions.

## 2021-05-03 NOTE — Telephone Encounter (Signed)
Pt called [facility] say [facility] [place] [facility] the Adder[facility] 30mg  in stock ( it needed a PA anyway). Can [facility] [place] [place] and then we will need to follow up with that [facility] to get the PA. Pt has [facility] that the PA will need to go thru. Call HelpDesk  [phone], with ID #[id number] ( I got this info already [facility] [place]. It will be the same PA info).

## 2021-05-03 NOTE — Telephone Encounter (Signed)
Disregard previous message pt was able to find normal dose pending for Dr Clovis Pu

## 2021-05-03 NOTE — Telephone Encounter (Signed)
Sent RX for Adderall 20 mg #90

## 2021-05-03 NOTE — Telephone Encounter (Signed)
Pt called again with a different pharmacy ,I just pended to another pharmacy earlier today as requested.If instructions need to be changed let me know Dr Clovis Pu and I can pend it there

## 2021-05-18 ENCOUNTER — Other Ambulatory Visit: Payer: Self-pay

## 2021-05-18 ENCOUNTER — Encounter: Payer: Self-pay | Admitting: Emergency Medicine

## 2021-05-18 ENCOUNTER — Ambulatory Visit
Admission: EM | Admit: 2021-05-18 | Discharge: 2021-05-18 | Disposition: A | Discharge status: Home / Self Care | Payer: 59 | Attending: Family Medicine | Admitting: Family Medicine

## 2021-05-18 DIAGNOSIS — Z9189 Other specified personal risk factors, not elsewhere classified: Secondary | ICD-10-CM

## 2021-05-18 DIAGNOSIS — J441 Chronic obstructive pulmonary disease with (acute) exacerbation: Secondary | ICD-10-CM

## 2021-05-18 MED ORDER — PREDNISONE 20 MG PO TABS: 40.0000 mg | ORAL_TABLET | Freq: Every day | ORAL | 0 refills | Status: DC

## 2021-05-18 MED ORDER — PROMETHAZINE-DM 6.25-15 MG/5ML PO SYRP: 5.0000 mL | ORAL_SOLUTION | Freq: Four times a day (QID) | ORAL | 0 refills | Status: DC | PRN

## 2021-05-18 MED ORDER — DOXYCYCLINE HYCLATE 100 MG PO CAPS: 100.0000 mg | ORAL_CAPSULE | Freq: Two times a day (BID) | ORAL | 0 refills | Status: DC

## 2021-05-18 NOTE — ED Provider Notes (Signed)
[facility]    CSN: [id number] Arrival date & time: 05/18/21  1736      History   Chief Complaint No chief complaint on file.   HPI [name] is a 66 y.o. female.   Presenting today with a month of progressively worsening nasal congestion, sinus pain and pressure, productive cough of green and brown sputum, chest tightness, wheezing, shortness of breath.  Denies fever, chills, chest pain, abdominal pain, nausea vomiting or diarrhea.  States that she has a history of COPD on Combivent and albuterol which does help temporarily with her breathing.   Past Medical History:  Diagnosis Date   Anxiety    Arthritis    COPD (chronic obstructive pulmonary disease) ([place])    Hypoglycemia    Insomnia    Kidney cysts    Multiple fractures of ribs of right side May 1982   multiple rib fracture and repair with plates and wires    Patient Active Problem List   Diagnosis Date Noted   Degenerative tear of medial meniscus of right knee 02/19/2019   GAD (generalized anxiety disorder) 03/01/2018   ADD (attention deficit disorder) 03/01/2018   Wound dehiscence    Infected open wound 12/26/2017   Left wrist fracture, sequela 12/26/2017   Adrenal adenoma, left 09/03/2016   CAD (coronary atherosclerotic disease) 09/03/2016   Smoker 07/29/2016   Moderate asthma 07/29/2016   Anxiety 07/29/2016   History of hysterectomy 07/29/2016   Arthritis 07/29/2016   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      Home Medications    Prior to Admission medications    Medication Sig Start Date End Date Taking? Authorizing Provider  amphetamine-dextroamphetamine (ADDERALL) 30 MG tablet 3/4 tablet three times daily 04/29/21   Cottle, Billey Co., MD  doxycycline (VIBRAMYCIN) 100 MG capsule Take 1 capsule (100 mg total) by mouth 2 (two) times daily. 05/18/21  Yes Volney American, PA-C  predniSONE (DELTASONE) 20 MG tablet Take 2 tablets (40 mg total) by mouth daily with breakfast. 05/18/21  Yes Volney American, PA-C  promethazine-dextromethorphan (PROMETHAZINE-DM) 6.25-15 MG/5ML syrup Take 5 mLs by mouth 4 (four) times daily as needed. 05/18/21  Yes Volney American, PA-C  albuterol (PROVENTIL) (2.5 MG/3ML) 0.083% nebulizer solution Take 3 mLs (2.5 mg total) by nebulization every 6 (six) hours as needed for wheezing or shortness of breath. 07/26/16   Briscoe Deutscher, DO  ALPRAZolam (XANAX) 0.5 MG tablet TAKE 1 TABLET BY MOUTH TWICE DAILY AS NEEDED FOR ANXIETY. MAX OF 40 TABLETS EVERY 30 DAYS 05/02/21   Cottle, Billey Co., MD  amphetamine-dextroamphetamine (ADDERALL) 20 MG tablet Take 1 tablet (20 mg total) by mouth 3 (three) times daily. 02/23/21   Cottle, Billey Co., MD  amphetamine-dextroamphetamine (ADDERALL) 20 MG tablet Take 1 tablet (20 mg total) by mouth 3 (three) times daily. 03/30/21   Cottle, Billey Co., MD  amphetamine-dextroamphetamine (ADDERALL) 20 MG tablet Take 1 tablet (20 mg total) by mouth 3 (three) times daily. 05/03/21   Cottle, Billey Co., MD  benzonatate (TESSALON) 100 MG capsule Take 1 capsule (100 mg total) by mouth every 8 (eight) hours. 11/13/20   Wurst, [place], PA-C  ibuprofen (ADVIL) 600 MG tablet Take 1 tablet (600 mg total) by mouth every 8 (eight) hours as needed. 03/05/19   Rosemarie Ax, MD  Ipratropium-Albuterol (COMBIVENT) 20-100 MCG/ACT AERS respimat Inhale 1 puff into the lungs 4 (four) times daily as needed. 07/26/16   Briscoe Deutscher, DO  polyethylene glycol powder (GLYCOLAX/MIRALAX) powder Take 17 g by mouth 2  (two) times daily. Until daily soft stools  OTC 05/03/17   Larene Pickett, PA-C  predniSONE (STERAPRED UNI-PAK 21 TAB) 10 MG (21) TBPK tablet Take by mouth daily. Take 6 tabs by mouth daily  for 2 days, then 5 tabs for 2 days, then 4 tabs for 2 days, then 3 tabs for 2 days, 2 tabs for 2 days, then 1 tab by mouth daily for 2 days 11/13/20   Stacey Drain, [place], PA-C  QUEtiapine (SEROQUEL XR) 300 MG 24 hr tablet Take 2 tablets (600 mg total) by mouth daily. 09/06/20   Cottle, Billey Co., MD  QUEtiapine (SEROQUEL) 100 MG tablet TAKE 1 TABLET( 100 MG TOTAL) BY MOUTH AT BEDTIME AS NEEDED TO AID WITH SLEEP 11/15/20   Cottle, Billey Co., MD    Family History Family History  Problem Relation Age of Onset   Sudden Cardiac Death Neg Hx     Social History Social History   Tobacco Use   Smoking status: Every Day    Packs/day: 0.50    Years: 42.00    Pack years: 21.00    Types: Cigarettes   Smokeless tobacco: Never  Vaping Use   Vaping Use: Every day  Substance Use Topics   Alcohol use: Yes    Comment: occassionally   Drug use: Yes    Types: Marijuana     Allergies   Cephalexin, Sulfa antibiotics, Hydrocodone, Sulfa antibiotics, and Hydrocodone-acetaminophen   Review of Systems Review of Systems Per HPI  Physical Exam Triage Vital Signs ED Triage Vitals [05/18/21 1855]  Enc Vitals Group     BP 112/75     Pulse Rate (!) 107     Resp 18     Temp 97.8 F (36.6 C)     Temp Source Oral     SpO2 97 %     Weight      Height      Head Circumference      Peak Flow      Pain Score 0     Pain Loc      Pain Edu?      Excl. in [place]?    No data found.  Updated Vital Signs BP 112/75 (BP Location: Right Arm)    Pulse (!) 107    Temp 97.8 F (36.6 C) (Oral)    Resp 18    SpO2 97%   Visual Acuity Right Eye Distance:   Left Eye Distance:   Bilateral Distance:    Right Eye Near:   Left Eye Near:    Bilateral Near:     Physical Exam Vitals and nursing note reviewed.   Constitutional:      Appearance: Normal appearance. She is not ill-appearing.  HENT:     Head: Atraumatic.     Right Ear: Tympanic membrane and external ear normal.     Left Ear: Tympanic membrane and external ear normal.     Nose: Congestion present.     Mouth/Throat:  Mouth: Mucous membranes are moist.     Pharynx: Posterior oropharyngeal erythema present.  Eyes:     Extraocular Movements: Extraocular movements intact.     Conjunctiva/sclera: Conjunctivae normal.  Cardiovascular:     Rate and Rhythm: Normal rate and regular rhythm.     Heart sounds: Normal heart sounds.  Pulmonary:     Effort: Pulmonary effort is normal.     Breath sounds: Wheezing present. No rales.     Comments: Decreased breath sounds throughout, moderate wheezes Musculoskeletal:        General: Normal range of motion.     Cervical back: Normal range of motion and neck supple.  Skin:    General: Skin is warm and dry.  Neurological:     Mental Status: She is alert and oriented to person, place, and time.  Psychiatric:        Mood and Affect: Mood normal.        Thought Content: Thought content normal.        Judgment: Judgment normal.     UC Treatments / Results  Labs (all labs ordered are listed, but only abnormal results are displayed) Labs Reviewed  COVID-19, FLU A+B NAA    EKG   Radiology No results found.  Procedures Procedures (including critical care time)  Medications Ordered in UC Medications - No data to display  Initial Impression / Assessment and Plan / UC Course  I have reviewed the triage vital signs and the nursing notes.  Pertinent labs & imaging results that were available during my care of the patient were reviewed by me and considered in my medical decision making (see chart for details).     We will treat with prednisone, doxycycline, Phenergan DM and continued inhaler regimen for a COPD exacerbation. COVID and flu testing pending for rule out.  Discussed  supportive care and return precautions.  Final Clinical Impressions(s) / UC Diagnoses   Final diagnoses:  At increased risk of exposure to COVID-19 virus  COPD exacerbation [facility])   Discharge Instructions   None    ED Prescriptions     Medication Sig Dispense Auth. Provider   predniSONE (DELTASONE) 20 MG tablet Take 2 tablets (40 mg total) by mouth daily with breakfast. 10 tablet Volney American, PA-C   doxycycline (VIBRAMYCIN) 100 MG capsule Take 1 capsule (100 mg total) by mouth 2 (two) times daily. 20 capsule Volney American, [place]   promethazine-dextromethorphan (PROMETHAZINE-DM) 6.25-15 MG/5ML syrup Take 5 mLs by mouth 4 (four) times daily as needed. 100 mL Volney American, [place]      PDMP not reviewed this encounter.   Volney American, [place] 05/18/21 1945

## 2021-05-18 NOTE — ED Triage Notes (Signed)
Productive Cough with green and brown sputum and SOB x 1 month.

## 2021-05-19 ENCOUNTER — Telehealth: Payer: Self-pay | Admitting: Emergency Medicine

## 2021-05-19 LAB — COVID-19, FLU A+B NAA
Influenza A, NAA: NOT DETECTED
Influenza B, NAA: NOT DETECTED
SARS-CoV-2, NAA: NOT DETECTED

## 2021-05-24 ENCOUNTER — Ambulatory Visit (INDEPENDENT_AMBULATORY_CARE_PROVIDER_SITE_OTHER): Payer: 59 | Admitting: Psychiatry

## 2021-05-24 ENCOUNTER — Encounter: Payer: Self-pay | Admitting: Psychiatry

## 2021-05-24 DIAGNOSIS — F3162 Bipolar disorder, current episode mixed, moderate: Secondary | ICD-10-CM | POA: Diagnosis not present

## 2021-05-24 DIAGNOSIS — F411 Generalized anxiety disorder: Secondary | ICD-10-CM

## 2021-05-24 DIAGNOSIS — F902 Attention-deficit hyperactivity disorder, combined type: Secondary | ICD-10-CM | POA: Diagnosis not present

## 2021-05-24 DIAGNOSIS — F5105 Insomnia due to other mental disorder: Secondary | ICD-10-CM | POA: Diagnosis not present
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
## 2021-05-31 ENCOUNTER — Other Ambulatory Visit: Payer: Self-pay | Admitting: Psychiatry

## 2021-05-31 DIAGNOSIS — F5105 Insomnia due to other mental disorder: Secondary | ICD-10-CM

## 2021-05-31 DIAGNOSIS — F411 Generalized anxiety disorder: Secondary | ICD-10-CM

## 2021-06-01 ENCOUNTER — Telehealth: Payer: Self-pay | Admitting: Adult Health

## 2021-06-01 ENCOUNTER — Other Ambulatory Visit: Payer: Self-pay

## 2021-06-01 DIAGNOSIS — F902 Attention-deficit hyperactivity disorder, combined type: Secondary | ICD-10-CM

## 2021-06-01 MED ORDER — AMPHETAMINE-DEXTROAMPHETAMINE 20 MG PO TABS: 20.0000 mg | ORAL_TABLET | Freq: Three times a day (TID) | ORAL | 0 refills | Status: DC

## 2021-06-01 NOTE — Telephone Encounter (Signed)
Pt called and said that she would like her adderall 20 mg sent to the [facility] located at [address]. The pharmacy has enough pills for a fill today. Please send it in today ?

## 2021-06-01 NOTE — Telephone Encounter (Signed)
Pended.

## 2021-07-01 ENCOUNTER — Other Ambulatory Visit: Payer: Self-pay | Admitting: Psychiatry

## 2021-07-01 ENCOUNTER — Telehealth: Payer: Self-pay | Admitting: Psychiatry

## 2021-07-01 DIAGNOSIS — F5105 Insomnia due to other mental disorder: Secondary | ICD-10-CM

## 2021-07-01 DIAGNOSIS — F411 Generalized anxiety disorder: Secondary | ICD-10-CM

## 2021-07-01 DIAGNOSIS — F902 Attention-deficit hyperactivity disorder, combined type: Secondary | ICD-10-CM

## 2021-07-01 MED ORDER — ALPRAZOLAM 0.5 MG PO TABS: ORAL_TABLET | ORAL | 0 refills | Status: DC

## 2021-07-01 MED ORDER — AMPHETAMINE-DEXTROAMPHETAMINE 20 MG PO TABS: 20.0000 mg | ORAL_TABLET | Freq: Three times a day (TID) | ORAL | 0 refills | Status: DC

## 2021-07-01 NOTE — Telephone Encounter (Signed)
Pt called requesting Rx for Adderall & Xanax. @ [facility]. Trying to get Rx due at same time due to transporation issues. Stated Adderall due 4/3. ?

## 2021-08-02 ENCOUNTER — Telehealth: Payer: Self-pay | Admitting: Psychiatry

## 2021-08-02 NOTE — Telephone Encounter (Signed)
Pt called at 10:43 am and asked for a refill on her adderall 20 mg. Her pharmacy [facility] [address] in [place] does have it in stock ?

## 2021-08-03 ENCOUNTER — Other Ambulatory Visit: Payer: Self-pay | Admitting: Psychiatry

## 2021-08-03 DIAGNOSIS — F411 Generalized anxiety disorder: Secondary | ICD-10-CM

## 2021-08-03 DIAGNOSIS — F5105 Insomnia due to other mental disorder: Secondary | ICD-10-CM

## 2021-08-03 DIAGNOSIS — F902 Attention-deficit hyperactivity disorder, combined type: Secondary | ICD-10-CM

## 2021-08-03 MED ORDER — AMPHETAMINE-DEXTROAMPHETAMINE 20 MG PO TABS: 20.0000 mg | ORAL_TABLET | Freq: Three times a day (TID) | ORAL | 0 refills | Status: DC

## 2021-08-03 NOTE — Telephone Encounter (Signed)
Pt called requesting Xanax & Adderall Rx be sent in same day each month. Pt has transportation issues.  Please note chart for future. ?Adderall sent yesterday ?

## 2021-08-04 ENCOUNTER — Telehealth: Payer: Self-pay | Admitting: Psychiatry

## 2021-08-04 NOTE — Telephone Encounter (Signed)
Next visit is 09/27/21. [name] called requesting a refill on her Xanax. It was not called in on 5/3 when she requested the Adderall. Please call to: ? ?[id number] - [place], [address]  ?[address], [place] [address]  ?Phone:  [phone]  Fax:  [phone]  ?

## 2021-08-04 NOTE — Telephone Encounter (Signed)
Xanax Rx sent ? ?

## 2021-09-01 ENCOUNTER — Telehealth: Payer: Self-pay | Admitting: Psychiatry

## 2021-09-01 NOTE — Telephone Encounter (Signed)
Pt called requesting Rx for generic Adderall and Alprazolam. Due Saturday for RF. [place]     Apt 6/27

## 2021-09-02 ENCOUNTER — Other Ambulatory Visit: Payer: Self-pay

## 2021-09-02 DIAGNOSIS — F411 Generalized anxiety disorder: Secondary | ICD-10-CM

## 2021-09-02 DIAGNOSIS — F902 Attention-deficit hyperactivity disorder, combined type: Secondary | ICD-10-CM

## 2021-09-02 DIAGNOSIS — F5105 Insomnia due to other mental disorder: Secondary | ICD-10-CM

## 2021-09-02 MED ORDER — ALPRAZOLAM 0.5 MG PO TABS: ORAL_TABLET | ORAL | 0 refills | Status: DC

## 2021-09-02 MED ORDER — AMPHETAMINE-DEXTROAMPHETAMINE 20 MG PO TABS: 20.0000 mg | ORAL_TABLET | Freq: Three times a day (TID) | ORAL | 0 refills | Status: DC

## 2021-09-02 NOTE — Telephone Encounter (Signed)
Pended.

## 2021-09-27 ENCOUNTER — Ambulatory Visit (INDEPENDENT_AMBULATORY_CARE_PROVIDER_SITE_OTHER): Payer: Medicaid Other | Admitting: Psychiatry

## 2021-09-27 ENCOUNTER — Encounter: Payer: Self-pay | Admitting: Psychiatry

## 2021-09-27 VITALS — BP 119/66 | HR 106

## 2021-09-27 DIAGNOSIS — F3162 Bipolar disorder, current episode mixed, moderate: Secondary | ICD-10-CM

## 2021-09-27 DIAGNOSIS — F5105 Insomnia due to other mental disorder: Secondary | ICD-10-CM | POA: Diagnosis not present

## 2021-09-27 DIAGNOSIS — F411 Generalized anxiety disorder: Secondary | ICD-10-CM

## 2021-09-27 DIAGNOSIS — F902 Attention-deficit hyperactivity disorder, combined type: Secondary | ICD-10-CM

## 2021-09-27 MED ORDER — AMPHETAMINE-DEXTROAMPHETAMINE 20 MG PO TABS: 20.0000 mg | ORAL_TABLET | Freq: Three times a day (TID) | ORAL | 0 refills | Status: DC

## 2021-09-27 MED ORDER — QUETIAPINE FUMARATE ER 300 MG PO TB24: 600.0000 mg | ORAL_TABLET | Freq: Every day | ORAL | 1 refills | Status: DC

## 2021-09-27 MED ORDER — QUETIAPINE FUMARATE 100 MG PO TABS: 100.0000 mg | ORAL_TABLET | Freq: Every day | ORAL | 1 refills | Status: DC

## 2021-09-27 MED ORDER — ALPRAZOLAM 0.5 MG PO TABS: ORAL_TABLET | ORAL | 0 refills | Status: DC

## 2021-10-31 ENCOUNTER — Telehealth: Payer: Self-pay | Admitting: Psychiatry

## 2021-10-31 ENCOUNTER — Other Ambulatory Visit: Payer: Self-pay

## 2021-10-31 DIAGNOSIS — F411 Generalized anxiety disorder: Secondary | ICD-10-CM

## 2021-10-31 DIAGNOSIS — F5105 Insomnia due to other mental disorder: Secondary | ICD-10-CM

## 2021-10-31 MED ORDER — ALPRAZOLAM 0.5 MG PO TABS: ORAL_TABLET | ORAL | 5 refills | Status: DC

## 2021-10-31 NOTE — Telephone Encounter (Signed)
Pt called requesting RF on 8/2 for Xanax and Adderall both generic @ [facility] [address] [place]. Needs to pick up both at same time.   Apt 12/28

## 2021-10-31 NOTE — Telephone Encounter (Signed)
Pended.

## 2021-11-08 ENCOUNTER — Telehealth: Payer: Self-pay | Admitting: Psychiatry

## 2021-11-08 NOTE — Telephone Encounter (Signed)
updated

## 2021-11-08 NOTE — Telephone Encounter (Signed)
Pt called to have her default pharmacy updated to [facility] in [place].  Next appt 12/28

## 2021-11-21 ENCOUNTER — Telehealth: Payer: Self-pay | Admitting: Psychiatry

## 2021-11-21 NOTE — Telephone Encounter (Signed)
[name] called and said that her Adderall has been called into the wrong pharmacy. It was sent to [facility] in [place], [place] instead of [place] [address]. Please correct and sent to the correct pharmacy.

## 2021-11-21 NOTE — Telephone Encounter (Signed)
She picked up rx on 8/2 I just spoke to pharmacy and cancelled remaining rx.Another one is not due until 8/30

## 2021-11-22 ENCOUNTER — Other Ambulatory Visit: Payer: Self-pay

## 2021-11-22 DIAGNOSIS — F3162 Bipolar disorder, current episode mixed, moderate: Secondary | ICD-10-CM

## 2021-11-22 MED ORDER — QUETIAPINE FUMARATE 100 MG PO TABS: 100.0000 mg | ORAL_TABLET | Freq: Every day | ORAL | 0 refills | Status: DC

## 2021-11-22 MED ORDER — QUETIAPINE FUMARATE ER 300 MG PO TB24: 600.0000 mg | ORAL_TABLET | Freq: Every day | ORAL | 0 refills | Status: DC

## 2021-11-22 NOTE — Telephone Encounter (Signed)
Pt called and said that she needs her seroquel 100 mg and '300mg'$   [facility] [address]. She has moved back to [place]

## 2021-11-23 NOTE — Telephone Encounter (Signed)
I sent it yesterday 

## 2021-11-23 NOTE — Telephone Encounter (Signed)
SenD rx

## 2021-11-29 ENCOUNTER — Other Ambulatory Visit: Payer: Self-pay

## 2021-11-29 ENCOUNTER — Telehealth: Payer: Self-pay | Admitting: Psychiatry

## 2021-11-29 DIAGNOSIS — F411 Generalized anxiety disorder: Secondary | ICD-10-CM

## 2021-11-29 DIAGNOSIS — F5105 Insomnia due to other mental disorder: Secondary | ICD-10-CM

## 2021-11-29 DIAGNOSIS — F902 Attention-deficit hyperactivity disorder, combined type: Secondary | ICD-10-CM

## 2021-11-29 MED ORDER — AMPHETAMINE-DEXTROAMPHETAMINE 20 MG PO TABS: 20.0000 mg | ORAL_TABLET | Freq: Three times a day (TID) | ORAL | 0 refills | Status: DC

## 2021-11-29 MED ORDER — ALPRAZOLAM 0.5 MG PO TABS: ORAL_TABLET | ORAL | 3 refills | Status: DC

## 2021-11-29 NOTE — Telephone Encounter (Signed)
Pended.

## 2021-11-29 NOTE — Telephone Encounter (Signed)
Pt says she would like refill of Adderall '20mg'$  and [facility]ax [facility]ent [facility] [place].  Adderall is in stock.  Next appt 12/28

## 2021-12-02 NOTE — Telephone Encounter (Signed)
Pt Lvm @ 10:39a.  She is upset that she has been getting both her meds on the same day, but now it was called in on separate days.  She can't afford to get it that way.  Pls call her back.  Next appt 12/28

## 2021-12-02 NOTE — Telephone Encounter (Signed)
Spoke to pt and she was able to pick up meds

## 2021-12-30 ENCOUNTER — Telehealth: Payer: Self-pay | Admitting: Psychiatry

## 2021-12-30 ENCOUNTER — Other Ambulatory Visit: Payer: Self-pay

## 2021-12-30 DIAGNOSIS — F3162 Bipolar disorder, current episode mixed, moderate: Secondary | ICD-10-CM

## 2021-12-30 MED ORDER — QUETIAPINE FUMARATE 100 MG PO TABS: 100.0000 mg | ORAL_TABLET | Freq: Every day | ORAL | 0 refills | Status: DC

## 2021-12-30 MED ORDER — AMPHETAMINE-DEXTROAMPHETAMINE 30 MG PO TABS: 30.0000 mg | ORAL_TABLET | Freq: Two times a day (BID) | ORAL | 0 refills | Status: DC

## 2021-12-30 MED ORDER — QUETIAPINE FUMARATE ER 300 MG PO TB24: 600.0000 mg | ORAL_TABLET | Freq: Every day | ORAL | 0 refills | Status: DC

## 2021-12-30 NOTE — Telephone Encounter (Signed)
Pended.

## 2021-12-30 NOTE — Telephone Encounter (Signed)
Patient stop by office with refill request for Adderall states that they only have the '30mg'$  and she doesn't like those very much. She also needs Setraline '100mg'$  and '300mg'$   as well as Xanax 0.'5mg'$ . Please [facility] [place] Appt 12/28

## 2022-01-30 ENCOUNTER — Telehealth: Payer: Self-pay | Admitting: Psychiatry

## 2022-01-30 NOTE — Telephone Encounter (Signed)
Last generic adderall Rx on file

## 2022-02-28 ENCOUNTER — Other Ambulatory Visit: Payer: Self-pay

## 2022-02-28 ENCOUNTER — Telehealth: Payer: Self-pay | Admitting: Psychiatry

## 2022-02-28 DIAGNOSIS — F5105 Insomnia due to other mental disorder: Secondary | ICD-10-CM

## 2022-02-28 DIAGNOSIS — F411 Generalized anxiety disorder: Secondary | ICD-10-CM

## 2022-02-28 DIAGNOSIS — F902 Attention-deficit hyperactivity disorder, combined type: Secondary | ICD-10-CM

## 2022-02-28 MED ORDER — ALPRAZOLAM 0.5 MG PO TABS: ORAL_TABLET | ORAL | 1 refills | Status: DC

## 2022-02-28 MED ORDER — AMPHETAMINE-DEXTROAMPHETAMINE 20 MG PO TABS: 20.0000 mg | ORAL_TABLET | Freq: Three times a day (TID) | ORAL | 0 refills | Status: DC

## 2022-02-28 NOTE — Telephone Encounter (Signed)
Next visit is 03/30/22. Requesting refill on Xanax and [facility] [address] [place], [place] [address]. Phone number is [phone]

## 2022-02-28 NOTE — Telephone Encounter (Signed)
Pended.

## 2022-03-26 ENCOUNTER — Other Ambulatory Visit: Payer: Self-pay | Admitting: Psychiatry

## 2022-03-26 DIAGNOSIS — F3162 Bipolar disorder, current episode mixed, moderate: Secondary | ICD-10-CM

## 2022-03-29 ENCOUNTER — Other Ambulatory Visit: Payer: Self-pay | Admitting: Psychiatry

## 2022-03-29 DIAGNOSIS — F3162 Bipolar disorder, current episode mixed, moderate: Secondary | ICD-10-CM

## 2022-03-30 ENCOUNTER — Ambulatory Visit (INDEPENDENT_AMBULATORY_CARE_PROVIDER_SITE_OTHER): Payer: Medicare Other | Admitting: Psychiatry

## 2022-03-30 ENCOUNTER — Encounter: Payer: Self-pay | Admitting: Psychiatry

## 2022-03-30 DIAGNOSIS — F902 Attention-deficit hyperactivity disorder, combined type: Secondary | ICD-10-CM | POA: Diagnosis not present

## 2022-03-30 DIAGNOSIS — F5105 Insomnia due to other mental disorder: Secondary | ICD-10-CM | POA: Diagnosis not present

## 2022-03-30 DIAGNOSIS — F3162 Bipolar disorder, current episode mixed, moderate: Secondary | ICD-10-CM | POA: Diagnosis not present

## 2022-03-30 DIAGNOSIS — F411 Generalized anxiety disorder: Secondary | ICD-10-CM

## 2022-03-30 MED ORDER — QUETIAPINE FUMARATE 100 MG PO TABS: 100.0000 mg | ORAL_TABLET | Freq: Every day | ORAL | 1 refills | Status: DC

## 2022-03-30 MED ORDER — AMPHETAMINE-DEXTROAMPHETAMINE 20 MG PO TABS: 20.0000 mg | ORAL_TABLET | Freq: Three times a day (TID) | ORAL | 0 refills | Status: DC

## 2022-03-30 MED ORDER — QUETIAPINE FUMARATE ER 300 MG PO TB24: 600.0000 mg | ORAL_TABLET | Freq: Every day | ORAL | 1 refills | Status: DC
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
## 2022-05-01 ENCOUNTER — Other Ambulatory Visit: Payer: Self-pay

## 2022-05-01 ENCOUNTER — Telehealth: Payer: Self-pay | Admitting: Psychiatry

## 2022-05-01 DIAGNOSIS — F411 Generalized anxiety disorder: Secondary | ICD-10-CM

## 2022-05-01 DIAGNOSIS — F5105 Insomnia due to other mental disorder: Secondary | ICD-10-CM

## 2022-05-01 MED ORDER — ALPRAZOLAM 0.5 MG PO TABS: ORAL_TABLET | ORAL | 0 refills | Status: DC

## 2022-05-01 NOTE — Telephone Encounter (Signed)
Pt [facility]. Will be going to [facility] next month. Apt 6/27

## 2022-05-01 NOTE — Telephone Encounter (Signed)
Pended.

## 2022-05-30 ENCOUNTER — Telehealth: Payer: Self-pay | Admitting: Psychiatry

## 2022-05-30 ENCOUNTER — Other Ambulatory Visit: Payer: Self-pay

## 2022-05-30 DIAGNOSIS — F902 Attention-deficit hyperactivity disorder, combined type: Secondary | ICD-10-CM

## 2022-05-30 DIAGNOSIS — F411 Generalized anxiety disorder: Secondary | ICD-10-CM

## 2022-05-30 DIAGNOSIS — F5105 Insomnia due to other mental disorder: Secondary | ICD-10-CM

## 2022-05-30 NOTE — Telephone Encounter (Signed)
Pended.

## 2022-05-30 NOTE — Telephone Encounter (Signed)
05/01/2022 03/30/2022 2  Dextroamp-Amphetamin 20 Mg Tab 90.00 30 Ca Cot [id number] Nor ([id number]) 0/0  Medicare [place] 05/01/2022 05/01/2022 2  Alprazolam 0.5 Mg Tablet 40.00 30 Ca Cot

## 2022-05-30 NOTE — Telephone Encounter (Signed)
[facility] to [facility] at [id number] [id number]: [phone]

## 2022-05-30 NOTE — Telephone Encounter (Signed)
[name] is asking for Xanax and Adderall. She needs them to go different pharmacy. Wants to go to [facility] in [place], [place]

## 2022-05-31 MED ORDER — AMPHETAMINE-DEXTROAMPHETAMINE 20 MG PO TABS: 20.0000 mg | ORAL_TABLET | Freq: Three times a day (TID) | ORAL | 0 refills | Status: DC

## 2022-05-31 NOTE — Telephone Encounter (Signed)
Artemisa called back at 9:45. She found her Adderall and Xanax at the [facility] on [address] in [place].  Please send both prescriptions there.

## 2022-05-31 NOTE — Telephone Encounter (Signed)
Patient called yesterday with this information and have pended to Dr. Clovis Pu.

## 2022-05-31 NOTE — Telephone Encounter (Signed)
Canceled scripts as [facility]

## 2022-05-31 NOTE — Telephone Encounter (Signed)
[name] called at 9:23 to report that she is going to call around and find the Adderall.  Once she knows who has it she will call back with that info., but she will also want the Xanax sent to the same pharmacy so she can get them both from the same pharmacy and at the same time. So don't fill the Xanax prescription either until she calls back.  She also doesn't know why the [facility] in [place] sent the request.  She says she never been in that store.

## 2022-05-31 NOTE — Telephone Encounter (Signed)
I see another encounter that already shows she wants them sent to [facility] in [place] and the scripts have been pended to Dr Clovis Pu.  I will close this encounter

## 2022-06-01 ENCOUNTER — Other Ambulatory Visit: Payer: Self-pay

## 2022-06-01 DIAGNOSIS — F902 Attention-deficit hyperactivity disorder, combined type: Secondary | ICD-10-CM

## 2022-06-01 DIAGNOSIS — F411 Generalized anxiety disorder: Secondary | ICD-10-CM

## 2022-06-01 DIAGNOSIS — F5105 Insomnia due to other mental disorder: Secondary | ICD-10-CM

## 2022-06-01 NOTE — Telephone Encounter (Signed)
Patient called 9:30am-  Rxs for both were supposed to go to [facility] [address] in [place]. Please repend.

## 2022-06-02 MED ORDER — ALPRAZOLAM 0.5 MG PO TABS: ORAL_TABLET | ORAL | 0 refills | Status: DC

## 2022-06-02 MED ORDER — AMPHETAMINE-DEXTROAMPHETAMINE 20 MG PO TABS: 20.0000 mg | ORAL_TABLET | Freq: Three times a day (TID) | ORAL | 0 refills | Status: DC

## 2022-06-02 NOTE — Telephone Encounter (Signed)
Stanislawa called again at 9:14 to check status of the Adderall prescription being to [facility] [place] [facility].  Looks like [facility] was [facility] but one was not sent to [facility].  Please send.

## 2022-06-02 NOTE — Telephone Encounter (Signed)
Pended.

## 2022-06-28 ENCOUNTER — Telehealth: Payer: Self-pay | Admitting: Psychiatry

## 2022-06-28 ENCOUNTER — Other Ambulatory Visit: Payer: Self-pay | Admitting: Psychiatry

## 2022-06-28 DIAGNOSIS — F902 Attention-deficit hyperactivity disorder, combined type: Secondary | ICD-10-CM

## 2022-06-28 DIAGNOSIS — F411 Generalized anxiety disorder: Secondary | ICD-10-CM

## 2022-06-28 DIAGNOSIS — F5105 Insomnia due to other mental disorder: Secondary | ICD-10-CM

## 2022-06-28 MED ORDER — ALPRAZOLAM 0.5 MG PO TABS: ORAL_TABLET | ORAL | 1 refills | Status: DC

## 2022-06-28 MED ORDER — AMPHETAMINE-DEXTROAMPHETAMINE 20 MG PO TABS: 20.0000 mg | ORAL_TABLET | Freq: Three times a day (TID) | ORAL | 0 refills | Status: DC

## 2022-06-28 NOTE — Telephone Encounter (Signed)
Pt called requesting a refill on her xanax and adderall 20 mg. [facility] [place]

## 2022-07-19 ENCOUNTER — Encounter: Payer: Self-pay | Admitting: *Deleted

## 2022-07-31 ENCOUNTER — Telehealth: Payer: Self-pay | Admitting: Psychiatry

## 2022-07-31 ENCOUNTER — Other Ambulatory Visit: Payer: Self-pay | Admitting: Psychiatry

## 2022-07-31 ENCOUNTER — Telehealth: Payer: Self-pay

## 2022-07-31 DIAGNOSIS — F3162 Bipolar disorder, current episode mixed, moderate: Secondary | ICD-10-CM

## 2022-07-31 DIAGNOSIS — F5105 Insomnia due to other mental disorder: Secondary | ICD-10-CM

## 2022-07-31 DIAGNOSIS — F902 Attention-deficit hyperactivity disorder, combined type: Secondary | ICD-10-CM

## 2022-07-31 DIAGNOSIS — F411 Generalized anxiety disorder: Secondary | ICD-10-CM

## 2022-07-31 MED ORDER — QUETIAPINE FUMARATE 100 MG PO TABS: 100.0000 mg | ORAL_TABLET | Freq: Every day | ORAL | 0 refills | Status: DC

## 2022-07-31 MED ORDER — ALPRAZOLAM 0.5 MG PO TABS
ORAL_TABLET | ORAL | 1 refills | Status: DC
Start: 2022-07-31 — End: 2022-09-29

## 2022-07-31 MED ORDER — AMPHETAMINE-DEXTROAMPHETAMINE 20 MG PO TABS
20.0000 mg | ORAL_TABLET | Freq: Three times a day (TID) | ORAL | 0 refills | Status: DC
Start: 2022-08-23 — End: 2022-11-01

## 2022-07-31 MED ORDER — QUETIAPINE FUMARATE ER 300 MG PO TB24: 600.0000 mg | ORAL_TABLET | Freq: Every day | ORAL | 0 refills | Status: DC

## 2022-07-31 MED ORDER — QUETIAPINE FUMARATE ER 300 MG PO TB24
600.0000 mg | ORAL_TABLET | Freq: Every day | ORAL | 0 refills | Status: DC
Start: 2022-07-31 — End: 2022-11-23

## 2022-07-31 NOTE — Telephone Encounter (Signed)
[name] called at 1:35 to request refills of Seroquel 100mg  and 300 mg, Adderall 20mg , and Xanax 0.5mg .  Appt 6/27.  She has a new pharmacy.  She is now using Therapist, occupational at [employer], [place]

## 2022-08-01 ENCOUNTER — Other Ambulatory Visit: Payer: Self-pay

## 2022-08-01 DIAGNOSIS — F902 Attention-deficit hyperactivity disorder, combined type: Secondary | ICD-10-CM

## 2022-08-01 NOTE — Telephone Encounter (Signed)
Patient called in for refill on Adderall 20mg . I informed patient that a prescription was sent to [facility] and patient will call and see if they will switch prescription to her new pharmacy and rtc to CR to let us know. She would like prescriptions sent to [facility] [address]

## 2022-08-01 NOTE — Telephone Encounter (Addendum)
Prescription pended to the requested pharmacy yesterday.

## 2022-08-01 NOTE — Telephone Encounter (Signed)
Addendum: Patient returned call to office to stating that the pharmacy would not transfer prescription for Adderall over and needs new prescription sent to [facility]

## 2022-08-02 MED ORDER — AMPHETAMINE-DEXTROAMPHETAMINE 20 MG PO TABS
20.0000 mg | ORAL_TABLET | Freq: Three times a day (TID) | ORAL | 0 refills | Status: DC
Start: 2022-08-02 — End: 2022-11-23

## 2022-08-02 NOTE — Telephone Encounter (Signed)
Pt called back at 10:24a stating to cancel this request.  Leave it in [place].  She will try to get a ride there.  But she said this is the only medicine not at [facility] and she wants it sent to [facility] in the future.  Next appt 6/27

## 2022-08-02 NOTE — Telephone Encounter (Signed)
Rx has been pended. Rx at [facility] canceled.

## 2022-08-02 NOTE — Telephone Encounter (Signed)
Pt called requesting Adderall dated 4/24 at [place]  location canc and send to [facility]

## 2022-08-31 ENCOUNTER — Other Ambulatory Visit: Payer: Self-pay | Admitting: Psychiatry

## 2022-08-31 ENCOUNTER — Telehealth: Payer: Self-pay | Admitting: Psychiatry

## 2022-08-31 NOTE — Telephone Encounter (Signed)
Pt called for refill of Adderall and Xanax.  She has to get a ride so she ask that you make the refills for the same day (June 1).  Pls send them both to    [id number] [place], [place] - [address] AT [facility] [address] [facility] [address], [place] [address] Phone: [phone]  Fax: [phone]   Next appt 6/27

## 2022-08-31 NOTE — Telephone Encounter (Signed)
She has refills of both at pharmacy

## 2022-09-28 ENCOUNTER — Ambulatory Visit: Payer: Medicaid Other | Admitting: Psychiatry

## 2022-09-29 ENCOUNTER — Other Ambulatory Visit: Payer: Self-pay

## 2022-09-29 ENCOUNTER — Telehealth: Payer: Self-pay | Admitting: Psychiatry

## 2022-09-29 DIAGNOSIS — F902 Attention-deficit hyperactivity disorder, combined type: Secondary | ICD-10-CM

## 2022-09-29 DIAGNOSIS — F5105 Insomnia due to other mental disorder: Secondary | ICD-10-CM

## 2022-09-29 DIAGNOSIS — F411 Generalized anxiety disorder: Secondary | ICD-10-CM

## 2022-09-29 NOTE — Telephone Encounter (Signed)
PT called and needs a refill on her xanax and her adderall 20 mg. [facility] [address]

## 2022-09-29 NOTE — Telephone Encounter (Signed)
Pended.

## 2022-10-01 MED ORDER — AMPHETAMINE-DEXTROAMPHETAMINE 20 MG PO TABS
20.0000 mg | ORAL_TABLET | Freq: Three times a day (TID) | ORAL | 0 refills | Status: DC
Start: 2022-10-01 — End: 2022-11-23

## 2022-10-01 MED ORDER — ALPRAZOLAM 0.5 MG PO TABS
ORAL_TABLET | ORAL | 1 refills | Status: DC
Start: 2022-10-01 — End: 2022-11-23

## 2022-11-01 ENCOUNTER — Telehealth: Payer: Self-pay | Admitting: Psychiatry

## 2022-11-01 ENCOUNTER — Other Ambulatory Visit: Payer: Self-pay

## 2022-11-01 DIAGNOSIS — F902 Attention-deficit hyperactivity disorder, combined type: Secondary | ICD-10-CM

## 2022-11-01 MED ORDER — AMPHETAMINE-DEXTROAMPHETAMINE 20 MG PO TABS
20.0000 mg | ORAL_TABLET | Freq: Three times a day (TID) | ORAL | 0 refills | Status: DC
Start: 2022-11-01 — End: 2022-11-23

## 2022-11-01 NOTE — Telephone Encounter (Signed)
Adderall pended to requested pharmacy. She has a RF available for alprazolam.

## 2022-11-01 NOTE — Telephone Encounter (Signed)
Pt is letting us know that she needs Rfs on both Xanax and Adderall. She gets them filled on the same day. Please send in and approve at: [facility] #[id number] - [place], [place] - [address]  [address], [place]

## 2022-11-23 ENCOUNTER — Ambulatory Visit (INDEPENDENT_AMBULATORY_CARE_PROVIDER_SITE_OTHER): Payer: 59 | Admitting: Psychiatry

## 2022-11-23 ENCOUNTER — Encounter: Payer: Self-pay | Admitting: Psychiatry

## 2022-11-23 DIAGNOSIS — F5105 Insomnia due to other mental disorder: Secondary | ICD-10-CM | POA: Diagnosis not present

## 2022-11-23 DIAGNOSIS — F902 Attention-deficit hyperactivity disorder, combined type: Secondary | ICD-10-CM | POA: Diagnosis not present

## 2022-11-23 DIAGNOSIS — F3162 Bipolar disorder, current episode mixed, moderate: Secondary | ICD-10-CM | POA: Diagnosis not present

## 2022-11-23 DIAGNOSIS — F411 Generalized anxiety disorder: Secondary | ICD-10-CM | POA: Diagnosis not present

## 2022-11-23 MED ORDER — AMPHETAMINE-DEXTROAMPHETAMINE 20 MG PO TABS
20.0000 mg | ORAL_TABLET | Freq: Three times a day (TID) | ORAL | 0 refills | Status: DC
Start: 2022-12-21 — End: 2023-03-05

## 2022-11-23 MED ORDER — QUETIAPINE FUMARATE ER 300 MG PO TB24
600.0000 mg | ORAL_TABLET | Freq: Every day | ORAL | 0 refills | Status: DC
Start: 2022-11-23 — End: 2023-01-30

## 2022-11-23 MED ORDER — ALPRAZOLAM 0.5 MG PO TABS
ORAL_TABLET | ORAL | 1 refills | Status: DC
Start: 2022-11-23 — End: 2023-01-30

## 2022-11-23 MED ORDER — AMPHETAMINE-DEXTROAMPHETAMINE 20 MG PO TABS
20.0000 mg | ORAL_TABLET | Freq: Three times a day (TID) | ORAL | 0 refills | Status: DC
Start: 2022-11-23 — End: 2023-03-05

## 2022-11-23 MED ORDER — QUETIAPINE FUMARATE 100 MG PO TABS
100.0000 mg | ORAL_TABLET | Freq: Every day | ORAL | 0 refills | Status: DC
Start: 2022-11-23 — End: 2023-01-30

## 2022-11-23 MED ORDER — AMPHETAMINE-DEXTROAMPHETAMINE 20 MG PO TABS
20.0000 mg | ORAL_TABLET | Freq: Three times a day (TID) | ORAL | 0 refills | Status: DC
Start: 2023-01-18 — End: 2023-03-05
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
## 2022-11-30 ENCOUNTER — Telehealth: Payer: Self-pay | Admitting: Psychiatry

## 2022-11-30 NOTE — Telephone Encounter (Signed)
Pt called at 10:10a stating that Dr Jennelle Human was supposed to send some papers for her disability.

## 2022-11-30 NOTE — Telephone Encounter (Signed)
She said the case worker has not received anything.  Pls call case worker Apolinar Junes at [phone].  Her case number is [id number] Brandon's fax number is [phone].  Pls call her back with status.  Next appt 2/25

## 2022-12-01 NOTE — Telephone Encounter (Signed)
To my knowledge, we have no paper work.  She needs to sign ROI if she hasn't.  We can send notes if she wants.

## 2022-12-01 NOTE — Telephone Encounter (Signed)
Please see message regarding paperwork for disability.

## 2022-12-07 NOTE — Telephone Encounter (Signed)
Patient notified all disability paperwork had been sent.

## 2023-01-30 ENCOUNTER — Other Ambulatory Visit: Payer: Self-pay | Admitting: Psychiatry

## 2023-01-30 ENCOUNTER — Other Ambulatory Visit: Payer: Self-pay

## 2023-01-30 ENCOUNTER — Telehealth: Payer: Self-pay | Admitting: Psychiatry

## 2023-01-30 DIAGNOSIS — F411 Generalized anxiety disorder: Secondary | ICD-10-CM

## 2023-01-30 DIAGNOSIS — F3162 Bipolar disorder, current episode mixed, moderate: Secondary | ICD-10-CM

## 2023-01-30 DIAGNOSIS — F5105 Insomnia due to other mental disorder: Secondary | ICD-10-CM

## 2023-01-30 MED ORDER — ALPRAZOLAM 0.5 MG PO TABS
ORAL_TABLET | ORAL | 2 refills | Status: DC
Start: 2023-01-30 — End: 2023-05-03

## 2023-01-30 NOTE — Telephone Encounter (Signed)
pended

## 2023-01-30 NOTE — Telephone Encounter (Signed)
[name] called to request refill of her Xanax. Appt 05/29/23.  Send to  [id number] - [place], [place] - [address] [facility] [address]

## 2023-01-30 NOTE — Telephone Encounter (Signed)
LF 8/22 AND 8/29; LV 08/22

## 2023-03-05 ENCOUNTER — Telehealth: Payer: Self-pay | Admitting: Psychiatry

## 2023-03-05 ENCOUNTER — Other Ambulatory Visit: Payer: Self-pay

## 2023-03-05 DIAGNOSIS — F902 Attention-deficit hyperactivity disorder, combined type: Secondary | ICD-10-CM

## 2023-03-05 MED ORDER — AMPHETAMINE-DEXTROAMPHETAMINE 20 MG PO TABS: 20.0000 mg | ORAL_TABLET | Freq: Three times a day (TID) | ORAL | 0 refills | Status: DC

## 2023-03-05 MED ORDER — AMPHETAMINE-DEXTROAMPHETAMINE 20 MG PO TABS
20.0000 mg | ORAL_TABLET | Freq: Three times a day (TID) | ORAL | 0 refills | Status: DC
Start: 2023-04-02 — End: 2023-05-30

## 2023-03-05 NOTE — Telephone Encounter (Signed)
PENDED 3 RX'S OF ADDERALL 20 MG TO REQUESTED PHARMACY

## 2023-03-05 NOTE — Telephone Encounter (Signed)
Pt lvm that she needs a refill on her adderall 20 mg. Pharmacy is [facility] on [address]. Pt is out

## 2023-03-27 ENCOUNTER — Other Ambulatory Visit: Payer: Self-pay | Admitting: Psychiatry

## 2023-03-27 DIAGNOSIS — F3162 Bipolar disorder, current episode mixed, moderate: Secondary | ICD-10-CM

## 2023-05-02 ENCOUNTER — Telehealth: Payer: Self-pay | Admitting: Psychiatry

## 2023-05-02 ENCOUNTER — Other Ambulatory Visit: Payer: Self-pay

## 2023-05-02 DIAGNOSIS — F411 Generalized anxiety disorder: Secondary | ICD-10-CM

## 2023-05-02 DIAGNOSIS — F5105 Insomnia due to other mental disorder: Secondary | ICD-10-CM

## 2023-05-02 NOTE — Telephone Encounter (Signed)
Pt asked for next RF to be put on file for Xanax. There should already be a RF on Adderall avaiable for her to get. She wants to try to pick them both up on Feb 1st. Send to: [facility] #[id number] - [place], [place] - [address] AT [facility] [address] [facility] [address]

## 2023-05-02 NOTE — Telephone Encounter (Signed)
Pended xanax 0.5mg  to [facility] pharmacy.

## 2023-05-03 ENCOUNTER — Other Ambulatory Visit: Payer: Self-pay | Admitting: Psychiatry

## 2023-05-03 DIAGNOSIS — F5105 Insomnia due to other mental disorder: Secondary | ICD-10-CM

## 2023-05-03 DIAGNOSIS — F411 Generalized anxiety disorder: Secondary | ICD-10-CM

## 2023-05-03 NOTE — Telephone Encounter (Signed)
Pt called at 2:49p asking about the refill of the xanax.  She wants to pick it and the Adderall  up at the same time.

## 2023-05-03 NOTE — Telephone Encounter (Signed)
Lf 1/2 for 20 days, lv 08/22 nv 2/25

## 2023-05-28 ENCOUNTER — Other Ambulatory Visit: Payer: Self-pay | Admitting: Psychiatry

## 2023-05-28 DIAGNOSIS — F3162 Bipolar disorder, current episode mixed, moderate: Secondary | ICD-10-CM

## 2023-05-29 ENCOUNTER — Ambulatory Visit: Payer: 59 | Admitting: Psychiatry

## 2023-05-30 ENCOUNTER — Telehealth: Payer: Self-pay | Admitting: Psychiatry

## 2023-05-30 ENCOUNTER — Other Ambulatory Visit: Payer: Self-pay

## 2023-05-30 ENCOUNTER — Other Ambulatory Visit: Payer: Self-pay | Admitting: Psychiatry

## 2023-05-30 DIAGNOSIS — F3162 Bipolar disorder, current episode mixed, moderate: Secondary | ICD-10-CM

## 2023-05-30 DIAGNOSIS — F902 Attention-deficit hyperactivity disorder, combined type: Secondary | ICD-10-CM

## 2023-05-30 MED ORDER — AMPHETAMINE-DEXTROAMPHETAMINE 20 MG PO TABS: 20.0000 mg | ORAL_TABLET | Freq: Three times a day (TID) | ORAL | 0 refills | Status: DC

## 2023-05-30 NOTE — Telephone Encounter (Signed)
 Pended adderall 20 mg rto [facility] pharm.

## 2023-05-30 NOTE — Telephone Encounter (Signed)
 [name] called at 11:30 to request refills of her Xanax and Adderall.  Appt 4/7.  Looks like she has a refill of the Xanax already, but does need the Adderall.  Send to   [facility] #[id number] - , [place] - [address] AT [address] [facility] [address]

## 2023-06-12 ENCOUNTER — Other Ambulatory Visit: Payer: Self-pay | Admitting: Psychiatry

## 2023-06-12 DIAGNOSIS — F3162 Bipolar disorder, current episode mixed, moderate: Secondary | ICD-10-CM

## 2023-06-29 ENCOUNTER — Other Ambulatory Visit: Payer: Self-pay

## 2023-06-29 ENCOUNTER — Telehealth: Payer: Self-pay | Admitting: Psychiatry

## 2023-06-29 DIAGNOSIS — F5105 Insomnia due to other mental disorder: Secondary | ICD-10-CM

## 2023-06-29 DIAGNOSIS — F411 Generalized anxiety disorder: Secondary | ICD-10-CM

## 2023-06-29 MED ORDER — ALPRAZOLAM 0.5 MG PO TABS: ORAL_TABLET | ORAL | 0 refills | Status: DC

## 2023-06-29 NOTE — Telephone Encounter (Signed)
 Pt called asking for a refill on her xanax. Pharmacy is [facility] on [address]

## 2023-06-29 NOTE — Telephone Encounter (Signed)
Pended Xanax.

## 2023-07-09 ENCOUNTER — Ambulatory Visit: Payer: 59 | Admitting: Psychiatry

## 2023-08-01 ENCOUNTER — Telehealth: Payer: Self-pay | Admitting: Psychiatry

## 2023-08-01 ENCOUNTER — Other Ambulatory Visit: Payer: Self-pay

## 2023-08-01 DIAGNOSIS — F902 Attention-deficit hyperactivity disorder, combined type: Secondary | ICD-10-CM

## 2023-08-01 DIAGNOSIS — F411 Generalized anxiety disorder: Secondary | ICD-10-CM

## 2023-08-01 DIAGNOSIS — F5105 Insomnia due to other mental disorder: Secondary | ICD-10-CM

## 2023-08-01 MED ORDER — AMPHETAMINE-DEXTROAMPHETAMINE 20 MG PO TABS
20.0000 mg | ORAL_TABLET | Freq: Three times a day (TID) | ORAL | 0 refills | Status: DC
Start: 2023-08-01 — End: 2023-09-10

## 2023-08-01 MED ORDER — ALPRAZOLAM 0.5 MG PO TABS: ORAL_TABLET | ORAL | 0 refills | Status: DC

## 2023-08-01 NOTE — Telephone Encounter (Signed)
 Pt LVM @ 1:48p requesting refill of Xanax  and Adderall to   [facility] #[id number] [place], [place] - [address] AT [facility] [address] [facility] [address] [place] [address] Phone: [phone]  Fax: [phone]   Next appt 6/9

## 2023-08-01 NOTE — Telephone Encounter (Signed)
 Pended both Adderall and alprazolam .

## 2023-08-28 ENCOUNTER — Telehealth: Payer: Self-pay | Admitting: Psychiatry

## 2023-08-28 NOTE — Telephone Encounter (Signed)
 LF both 4/30, due 5/28

## 2023-08-28 NOTE — Telephone Encounter (Signed)
 Patient called in for refill on Alprazolam  0.27m and Adderall 20mg . Ph: [phone] Appt 6/9 Pharmacy [facility] [id number] [place], [place]

## 2023-08-29 ENCOUNTER — Other Ambulatory Visit: Payer: Self-pay

## 2023-08-29 DIAGNOSIS — F902 Attention-deficit hyperactivity disorder, combined type: Secondary | ICD-10-CM

## 2023-08-29 DIAGNOSIS — F5105 Insomnia due to other mental disorder: Secondary | ICD-10-CM

## 2023-08-29 DIAGNOSIS — F411 Generalized anxiety disorder: Secondary | ICD-10-CM

## 2023-08-29 MED ORDER — AMPHETAMINE-DEXTROAMPHETAMINE 20 MG PO TABS
20.0000 mg | ORAL_TABLET | Freq: Three times a day (TID) | ORAL | 0 refills | Status: DC
Start: 2023-08-29 — End: 2023-10-28

## 2023-08-29 MED ORDER — ALPRAZOLAM 0.5 MG PO TABS
ORAL_TABLET | ORAL | 0 refills | Status: DC
Start: 2023-08-29 — End: 2023-09-10

## 2023-08-29 NOTE — Telephone Encounter (Signed)
 Pended both Adderall and alprazolam .

## 2023-09-10 ENCOUNTER — Encounter: Payer: Self-pay | Admitting: Psychiatry

## 2023-09-10 ENCOUNTER — Ambulatory Visit: Admitting: Psychiatry

## 2023-09-10 DIAGNOSIS — F411 Generalized anxiety disorder: Secondary | ICD-10-CM | POA: Diagnosis not present

## 2023-09-10 DIAGNOSIS — F902 Attention-deficit hyperactivity disorder, combined type: Secondary | ICD-10-CM

## 2023-09-10 DIAGNOSIS — F3162 Bipolar disorder, current episode mixed, moderate: Secondary | ICD-10-CM

## 2023-09-10 DIAGNOSIS — F5105 Insomnia due to other mental disorder: Secondary | ICD-10-CM

## 2023-09-10 MED ORDER — AMPHETAMINE-DEXTROAMPHETAMINE 20 MG PO TABS
20.0000 mg | ORAL_TABLET | Freq: Three times a day (TID) | ORAL | 0 refills | Status: DC
Start: 2023-10-08 — End: 2023-10-28

## 2023-09-10 MED ORDER — ALPRAZOLAM 0.5 MG PO TABS: ORAL_TABLET | ORAL | 0 refills | Status: DC

## 2023-09-10 MED ORDER — QUETIAPINE FUMARATE 100 MG PO TABS: 100.0000 mg | ORAL_TABLET | Freq: Every day | ORAL | 0 refills | Status: DC

## 2023-09-10 MED ORDER — CLONIDINE HCL 0.1 MG PO TABS: ORAL_TABLET | ORAL | 1 refills | Status: DC

## 2023-09-10 MED ORDER — QUETIAPINE FUMARATE ER 300 MG PO TB24
600.0000 mg | ORAL_TABLET | Freq: Every day | ORAL | 1 refills | Status: DC
Start: 2023-09-10 — End: 2024-02-03

## 2023-09-10 MED ORDER — AMPHETAMINE-DEXTROAMPHETAMINE 20 MG PO TABS: 20.0000 mg | ORAL_TABLET | Freq: Three times a day (TID) | ORAL | 0 refills | Status: DC
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
## 2023-09-28 ENCOUNTER — Telehealth: Payer: Self-pay | Admitting: Psychiatry

## 2023-09-28 NOTE — Telephone Encounter (Signed)
 Pt called asking for a refill on her xanax . Pharmacy is [facility] on [address]

## 2023-09-28 NOTE — Telephone Encounter (Addendum)
 Rx was sent on 6/9 at her appt to the appropriate pharmacy. Left VM per DPR with this information.

## 2023-10-25 ENCOUNTER — Telehealth: Payer: Self-pay | Admitting: Psychiatry

## 2023-10-25 NOTE — Telephone Encounter (Signed)
 Pt called asking for a refills on her xanax  and her adderall 20 mg. Pharmacy is [facility] on [address]

## 2023-10-25 NOTE — Telephone Encounter (Signed)
 10/01/2023 09/10/2023 2  Dextroamp-Amphetamin 20 Mg Tab 90.00 30 Ca Cot [id number] [facility] ([id number] 0/0  Medicare [place] 09/28/2023 09/10/2023 2  Alprazolam  0.5 Mg Tablet 40.00  Alprazolam  due 7/25 Adderall due 7/28

## 2023-10-26 ENCOUNTER — Other Ambulatory Visit: Payer: Self-pay

## 2023-10-26 DIAGNOSIS — F411 Generalized anxiety disorder: Secondary | ICD-10-CM

## 2023-10-26 DIAGNOSIS — F5105 Insomnia due to other mental disorder: Secondary | ICD-10-CM

## 2023-10-26 MED ORDER — ALPRAZOLAM 0.5 MG PO TABS: ORAL_TABLET | ORAL | 4 refills | Status: DC

## 2023-10-26 NOTE — Telephone Encounter (Signed)
 Pended alprazolam  7/25

## 2023-10-28 ENCOUNTER — Other Ambulatory Visit: Payer: Self-pay

## 2023-10-28 DIAGNOSIS — F902 Attention-deficit hyperactivity disorder, combined type: Secondary | ICD-10-CM

## 2023-10-28 NOTE — Telephone Encounter (Signed)
Pended Adderall 

## 2023-10-29 MED ORDER — AMPHETAMINE-DEXTROAMPHETAMINE 20 MG PO TABS: 20.0000 mg | ORAL_TABLET | Freq: Three times a day (TID) | ORAL | 0 refills | Status: DC

## 2023-11-26 ENCOUNTER — Other Ambulatory Visit: Payer: Self-pay

## 2023-11-26 ENCOUNTER — Telehealth: Payer: Self-pay | Admitting: Psychiatry

## 2023-11-26 DIAGNOSIS — F902 Attention-deficit hyperactivity disorder, combined type: Secondary | ICD-10-CM

## 2023-11-26 MED ORDER — AMPHETAMINE-DEXTROAMPHETAMINE 10 MG PO TABS: 60.0000 mg | ORAL_TABLET | Freq: Three times a day (TID) | ORAL | 0 refills | Status: DC

## 2023-11-26 NOTE — Telephone Encounter (Signed)
 Pended 10 mg, #180. She is aware it will likely require a PA for qty.

## 2023-11-26 NOTE — Telephone Encounter (Signed)
 Patient called in regarding Adderall. States that due to shortage pharmacy is out of 20mg  pills. They have 10mg  in stock and pharmacy could fill 10mg  of a 180 tablets. Ph: [phone] Appt 12/9 Pharmacy [facility] [address] [place]

## 2023-12-25 ENCOUNTER — Other Ambulatory Visit: Payer: Self-pay

## 2023-12-25 ENCOUNTER — Telehealth: Payer: Self-pay | Admitting: Psychiatry

## 2023-12-25 DIAGNOSIS — F902 Attention-deficit hyperactivity disorder, combined type: Secondary | ICD-10-CM

## 2023-12-25 MED ORDER — AMPHETAMINE-DEXTROAMPHETAMINE 10 MG PO TABS: 60.0000 mg | ORAL_TABLET | Freq: Three times a day (TID) | ORAL | 0 refills | Status: AC

## 2023-12-25 NOTE — Telephone Encounter (Signed)
 Pended

## 2023-12-25 NOTE — Telephone Encounter (Signed)
 Pt needs a rf of Adderall 10mg  because the pharm doesn't have 20 mg and she needs 180 pills. She knows her ins will make her pay $51.    [facility] [address]

## 2023-12-26 NOTE — Telephone Encounter (Signed)
 Pharmacy called and said that the directions are wrong for the adderall. It needs to say 2 of the 10 mg 3 day instead of 6 pills 3 x d wich is to high of a dose. Please cancel and re send

## 2024-01-24 ENCOUNTER — Telehealth: Payer: Self-pay | Admitting: Psychiatry

## 2024-01-24 NOTE — Telephone Encounter (Signed)
Updated pharmacy profile.

## 2024-01-24 NOTE — Telephone Encounter (Signed)
 She hasn't asked for any RF.  01/02/2024 10/29/2023 2  Dextroamp-Amphetamin 20 Mg Tab 90.00 30 Ca Cot [id number] [facility] 0/0  Medicare [place] 12/25/2023 10/26/2023 2  Alprazolam  0.5 Mg Tablet 40.00 20

## 2024-01-24 NOTE — Telephone Encounter (Signed)
 Patient called in stating that she has moved to [place] and needs all medications sent to new pharmacy going forward. Ph: [phone] Appt 12/9 Pharmacy [facility] [address] [place], [place]

## 2024-01-29 ENCOUNTER — Telehealth: Payer: Self-pay | Admitting: Psychiatry

## 2024-01-29 NOTE — Telephone Encounter (Signed)
 Pt called requesting new Rx to [facility] [address] [place], [place]  May have RF at previous location [facility]). Moved back to [place]. Adderall 20 mg  Alprazolam  0.5 mg and Seroquel  XR 300 mg & Seroquel  100 mg  Apt 12/9  Pt stated not doing so good. Has not been this bad in several years. Angry & hitting things. Crying out burst. RTC [phone]

## 2024-01-30 ENCOUNTER — Other Ambulatory Visit: Payer: Self-pay

## 2024-01-30 DIAGNOSIS — F902 Attention-deficit hyperactivity disorder, combined type: Secondary | ICD-10-CM

## 2024-01-30 DIAGNOSIS — F5105 Insomnia due to other mental disorder: Secondary | ICD-10-CM

## 2024-01-30 DIAGNOSIS — F411 Generalized anxiety disorder: Secondary | ICD-10-CM

## 2024-01-30 MED ORDER — AMPHETAMINE-DEXTROAMPHETAMINE 20 MG PO TABS: 20.0000 mg | ORAL_TABLET | Freq: Three times a day (TID) | ORAL | 0 refills | Status: AC

## 2024-01-30 MED ORDER — ALPRAZOLAM 0.5 MG PO TABS: ORAL_TABLET | ORAL | 1 refills | Status: DC

## 2024-01-30 MED ORDER — AMPHETAMINE-DEXTROAMPHETAMINE 20 MG PO TABS: 20.0000 mg | ORAL_TABLET | Freq: Three times a day (TID) | ORAL | 0 refills | Status: DC

## 2024-01-30 NOTE — Telephone Encounter (Signed)
 Pt called to report having anger outbursts and crying spells. She reports throwing her reading glasses and them stomping on them. She said she never hit anyone or harmed other's property. She has moved back to [place] and is much happier there. She reports she is out of alprazolam  and that is probably a contributing factor. I pended both alprazolam  and Adderall separately. She is hyperverbal, mentions people by name as if I know who they are. It is difficult to ask questions due to the above. She is sleeping because of the Seroquel . Tried to get her to rate depression and anxiety, but unable to get this info, also due to the above. Has FU in Dec.   Taking: Adderall 20 mg TID Xanax  0.5 BID - #40/mo. Said sometimes she doesn't take them all every month Clonidine  - not taking, didn't feel it made a difference Seroquel  XR 300 - Rx is for 2 at bedtime, said she usually only takes one Seroquel  100 - Takes at bedtime with one XR

## 2024-01-30 NOTE — Telephone Encounter (Signed)
 I will send the requested prescriptions.  However she needs to increase quetiapine  ER 600 mg nightly.  Remind her that purpose of medication is to help with mood swings and depression and anger outbursts.  It worked in the past.

## 2024-01-31 NOTE — Telephone Encounter (Signed)
 Patient informed of scripts and recommendations to increase Seroquel  back to 600 ER at bedtime.

## 2024-01-31 NOTE — Telephone Encounter (Signed)
 VM has not been set up. Will call again.

## 2024-02-02 ENCOUNTER — Other Ambulatory Visit: Payer: Self-pay | Admitting: Psychiatry

## 2024-02-02 DIAGNOSIS — F3162 Bipolar disorder, current episode mixed, moderate: Secondary | ICD-10-CM

## 2024-02-22 ENCOUNTER — Other Ambulatory Visit: Payer: Self-pay

## 2024-02-22 ENCOUNTER — Encounter: Payer: Self-pay | Admitting: Emergency Medicine

## 2024-02-22 ENCOUNTER — Ambulatory Visit
Admission: EM | Admit: 2024-02-22 | Discharge: 2024-02-22 | Disposition: A | Discharge status: Home / Self Care | Attending: Nurse Practitioner | Admitting: Nurse Practitioner

## 2024-02-22 DIAGNOSIS — S025XXB Fracture of tooth (traumatic), initial encounter for open fracture: Secondary | ICD-10-CM

## 2024-02-22 DIAGNOSIS — K0889 Other specified disorders of teeth and supporting structures: Secondary | ICD-10-CM | POA: Diagnosis not present

## 2024-02-22 MED ORDER — AMOXICILLIN-POT CLAVULANATE 875-125 MG PO TABS: 1.0000 | ORAL_TABLET | Freq: Two times a day (BID) | ORAL | 0 refills | Status: AC

## 2024-02-22 MED ORDER — IBUPROFEN 800 MG PO TABS: 800.0000 mg | ORAL_TABLET | Freq: Three times a day (TID) | ORAL | 0 refills | Status: AC | PRN

## 2024-02-22 NOTE — Discharge Instructions (Signed)
 Take the Augmentin  as prescribed to treat for dental infection.  You can take Tylenol  or ibuprofen  as needed for dental pain.  Recommend close follow up with a Dentist.

## 2024-02-22 NOTE — ED Triage Notes (Signed)
 Pt reports I know I need all my teeth pulled but in the last few days my mouth had been more sensitive. Denies any known fever or nausea.

## 2024-02-22 NOTE — ED Provider Notes (Signed)
 [facility]    CSN: [id number] Arrival date & time: 02/22/24  1345      History   Chief Complaint Chief Complaint  Patient presents with   Dental Pain    HPI [name] is a 68 y.o. female.   Patient presents today for worsening dentalgia over the past few days.  Reports she is aware she has multiple broken teeth that need to be removed but is concern for dental infection.  Denies fevers or nausea/vomiting.  Reports he has multiple areas in her mouth that feels swollen.  She has had difficulty eating because of the pain.  Has not taken any Tylenol  or ibuprofen  because that does not work for me.    Past Medical History:  Diagnosis Date   Anxiety    Arthritis    COPD (chronic obstructive pulmonary disease) (HCC)    Hypoglycemia    Insomnia    Kidney cysts    Multiple fractures of ribs of right side May 1982   multiple rib fracture and repair with plates and wires    Patient Active Problem List   Diagnosis Date Noted   Degenerative tear of medial meniscus of right knee 02/19/2019   GAD (generalized anxiety disorder) 03/01/2018   ADD (attention deficit disorder) 03/01/2018   Wound dehiscence    Infected open wound 12/26/2017   Left wrist fracture, sequela 12/26/2017   Adrenal adenoma, left 09/03/2016   CAD (coronary atherosclerotic disease) 09/03/2016   Smoker 07/29/2016   Moderate asthma 07/29/2016   Anxiety 07/29/2016   History of hysterectomy 07/29/2016   Arthritis 07/29/2016   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      Home  Medications    Prior to Admission medications   Medication Sig Start Date End Date Taking? Authorizing Provider  amoxicillin -clavulanate (AUGMENTIN ) 875-125 MG tablet Take 1 tablet by mouth 2 (two) times daily for 7 days. 02/22/24 02/29/24 Yes Chandra Harlene LABOR, NP  ibuprofen  (ADVIL ) 800 MG tablet Take 1 tablet (800 mg total) by mouth every 8 (eight) hours as needed. Take with food to prevent GI upset 02/22/24  Yes Chandra Harlene A, NP  rosuvastatin (CRESTOR) 10 MG tablet Take 10 mg by mouth. 02/18/24  Yes [provider]  albuterol  (PROVENTIL ) (2.5 MG/3ML) 0.083% nebulizer solution Take 3 mLs (2.5 mg total) by nebulization every 6 (six) hours as needed for wheezing or shortness of breath. 07/26/16   Prentiss Frieze, DO  ALPRAZolam  (XANAX ) 0.5 MG tablet TAKE 1 TABLET BY MOUTH TWICE DAILY AS NEEDED FOR ANXIETY. MAX OF 40 TABLETS EVERY 30 DAYS 01/30/24   Cottle, Lorene KANDICE Raddle., MD  amphetamine -dextroamphetamine  (ADDERALL) 10 MG tablet Take 6 tablets (60 mg total) by mouth 3 (three) times daily. 12/25/23   Cottle, Lorene KANDICE Raddle., MD  amphetamine -dextroamphetamine  (ADDERALL) 20 MG tablet Take 1 tablet (20 mg total) by mouth 3 (three) times daily. 11/25/23   Cottle, Lorene KANDICE Raddle., MD  amphetamine -dextroamphetamine  (ADDERALL) 20 MG tablet Take 1 tablet (20 mg total) by mouth 3 (three) times daily. 10/29/23  Cottle, Lorene KANDICE Raddle., MD  amphetamine -dextroamphetamine  (ADDERALL) 20 MG tablet Take 1 tablet (20 mg total) by mouth 3 (three) times daily. 01/30/24   Cottle, Lorene KANDICE Raddle., MD  amphetamine -dextroamphetamine  (ADDERALL) 20 MG tablet Take 1 tablet (20 mg total) by mouth 3 (three) times daily. 02/27/24   Cottle, Carey G Jr., MD  budesonide-formoterol (SYMBICORT) 80-4.5 MCG/ACT inhaler 1 puff as needed Inhalation every 4 hrs    [provider]  cloNIDine  (CATAPRES ) 0.1 MG tablet 1/2 tablet each morning for 1 week then 1 tablet each morning. 09/10/23   Cottle, Lorene KANDICE Raddle., MD  Ipratropium-Albuterol   (COMBIVENT) 20-100 MCG/ACT AERS respimat Inhale 1 puff into the lungs 4 (four) times daily as needed. 07/26/16   Prentiss Frieze, DO  polyethylene glycol powder (GLYCOLAX /MIRALAX ) powder Take 17 g by mouth 2 (two) times daily. Until daily soft stools  OTC 05/03/17   Jarold Olam HERO, PA-C  QUEtiapine  (SEROQUEL  XR) 300 MG 24 hr tablet TAKE 2 TABLETS BY MOUTH EVERY NIGHT AT BEDTIME 02/03/24   Cottle, Lorene KANDICE Raddle., MD  QUEtiapine  (SEROQUEL ) 100 MG tablet TAKE 1 TABLET(100 MG) BY MOUTH AT BEDTIME 02/03/24   Cottle, Lorene KANDICE Raddle., MD  TRULANCE 3 MG TABS Take 1 tablet by mouth daily.    [provider]    Family History Family History  Problem Relation Age of Onset   Sudden Cardiac Death Neg Hx     Social History Social History   Tobacco Use   Smoking status: Every Day    Current packs/day: 0.50    Average packs/day: 0.5 packs/day for 42.0 years (21.0 ttl pk-yrs)    Types: Cigarettes   Smokeless tobacco: Never  Vaping Use   Vaping status: Every Day  Substance Use Topics   Alcohol use: Yes    Comment: occassionally   Drug use: Yes    Types: Marijuana     Allergies   Cephalexin , Sulfa antibiotics, Hydrocodone, Sulfa antibiotics, and Hydrocodone-acetaminophen    Review of Systems Review of Systems Per HPI  Physical Exam Triage Vital Signs ED Triage Vitals  Encounter Vitals Group     BP 02/22/24 1401 137/88     Girls Systolic BP Percentile --      Girls Diastolic BP Percentile --      Boys Systolic BP Percentile --      Boys Diastolic BP Percentile --      Pulse Rate 02/22/24 1401 77     Resp 02/22/24 1401 20     Temp 02/22/24 1401 98 F (36.7 C)     Temp Source 02/22/24 1401 Oral     SpO2 02/22/24 1401 96 %     Weight --      Height --      Head Circumference --      Peak Flow --      Pain Score 02/22/24 1359 7     Pain Loc --      Pain Education --      Exclude from Growth Chart --    No data found.  Updated Vital Signs BP 137/88 (BP Location: Right Arm)    Pulse 77   Temp 98 F (36.7 C) (Oral)   Resp 20   SpO2 96%   Visual Acuity Right Eye Distance:   Left Eye Distance:   Bilateral Distance:    Right Eye Near:   Left Eye Near:    Bilateral Near:     Physical Exam Vitals and nursing note reviewed.  Constitutional:  General: She is not in acute distress.    Appearance: Normal appearance. She is not toxic-appearing.  HENT:     Right Ear: External ear normal.     Left Ear: External ear normal.     Mouth/Throat:     Mouth: Mucous membranes are moist. No oral lesions.     Dentition: Abnormal dentition. Dental tenderness and dental caries present. No gingival swelling, dental abscesses or gum lesions.     Pharynx: Oropharynx is clear.     Comments: No obvious dental abscess.  There are multiple broken and carious teeth to upper and lower gum lines.  Significant gingivitis noted upper and lower teeth. Pulmonary:     Effort: Pulmonary effort is normal. No respiratory distress.  Musculoskeletal:     Cervical back: Normal range of motion.  Lymphadenopathy:     Cervical: No cervical adenopathy.  Skin:    General: Skin is warm and dry.     Capillary Refill: Capillary refill takes less than 2 seconds.  Neurological:     Mental Status: She is alert and oriented to person, place, and time.  Psychiatric:        Behavior: Behavior is cooperative.      UC Treatments / Results  Labs (all labs ordered are listed, but only abnormal results are displayed) Labs Reviewed - No data to display  EKG   Radiology No results found.  Procedures Procedures (including critical care time)  Medications Ordered in UC Medications - No data to display  Initial Impression / Assessment and Plan / UC Course  I have reviewed the triage vital signs and the nursing notes.  Pertinent labs & imaging results that were available during my care of the patient were reviewed by me and considered in my medical decision making (see chart for  details).   Vital signs are stable and patient overall is well-appearing.  On exam of her mouth, she appears to have significant gingivitis, no appreciable dental abscess.  Given significant pain, will cover for dental infection with Augmentin  twice daily for 7 days.  Patient reports she has tolerated amoxicillin  well in the past despite the allergy to Keflex .  Ibuprofen  sent to the pharmacy for her for pain control.  Recommended close follow-up with dentist.  ER precautions discussed.  The patient was given the opportunity to ask questions.  All questions answered to their satisfaction.  The patient is in agreement to this plan.   Final Clinical Impressions(s) / UC Diagnoses   Final diagnoses:  Dentalgia  Open fracture of incisor teeth, initial encounter     Discharge Instructions      Take the Augmentin  as prescribed to treat for dental infection.  You can take Tylenol  or ibuprofen  as needed for dental pain.  Recommend close follow up with a Dentist.    ED Prescriptions     Medication Sig Dispense Auth. Provider   amoxicillin -clavulanate (AUGMENTIN ) 875-125 MG tablet Take 1 tablet by mouth 2 (two) times daily for 7 days. 14 tablet Chandra Raisin A, NP   ibuprofen  (ADVIL ) 800 MG tablet Take 1 tablet (800 mg total) by mouth every 8 (eight) hours as needed. Take with food to prevent GI upset 30 tablet Chandra Raisin LABOR, NP      I have reviewed the PDMP during this encounter.   Chandra Raisin LABOR, NP 02/22/24 1428

## 2024-02-27 ENCOUNTER — Telehealth: Payer: Self-pay | Admitting: Psychiatry

## 2024-02-27 NOTE — Telephone Encounter (Signed)
 Patient called in for refill on Xanax  0.5mg  and Adderall 20mg . Ph: [phone] Appt 12/9 Pharmacy [facility] [address] [place], [place]

## 2024-02-27 NOTE — Telephone Encounter (Signed)
 Pt has adderall 20 mg on file to fill when appropriate and she also has one refill Xanax  on file when due.

## 2024-03-11 ENCOUNTER — Encounter: Payer: Self-pay | Admitting: Psychiatry

## 2024-03-11 ENCOUNTER — Ambulatory Visit: Admitting: Psychiatry

## 2024-03-11 DIAGNOSIS — F902 Attention-deficit hyperactivity disorder, combined type: Secondary | ICD-10-CM

## 2024-03-11 DIAGNOSIS — F411 Generalized anxiety disorder: Secondary | ICD-10-CM | POA: Diagnosis not present

## 2024-03-11 DIAGNOSIS — F3162 Bipolar disorder, current episode mixed, moderate: Secondary | ICD-10-CM | POA: Diagnosis not present

## 2024-03-11 DIAGNOSIS — F5105 Insomnia due to other mental disorder: Secondary | ICD-10-CM

## 2024-03-11 MED ORDER — AMPHETAMINE-DEXTROAMPHETAMINE 20 MG PO TABS: 20.0000 mg | ORAL_TABLET | Freq: Three times a day (TID) | ORAL | 0 refills | Status: AC

## 2024-03-11 MED ORDER — CLONAZEPAM 0.5 MG PO TABS: 0.5000 mg | ORAL_TABLET | Freq: Two times a day (BID) | ORAL | 0 refills | Status: DC | PRN

## 2024-03-11 MED ORDER — CLONIDINE HCL 0.2 MG PO TABS: 0.2000 mg | ORAL_TABLET | Freq: Every morning | ORAL | 0 refills | Status: DC

## 2024-03-11 MED ORDER — QUETIAPINE FUMARATE ER 400 MG PO TB24: 400.0000 mg | ORAL_TABLET | Freq: Every day | ORAL | 1 refills | Status: AC
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
## 2024-03-18 ENCOUNTER — Other Ambulatory Visit: Payer: Self-pay | Admitting: Psychiatry

## 2024-03-18 DIAGNOSIS — F411 Generalized anxiety disorder: Secondary | ICD-10-CM

## 2024-03-31 ENCOUNTER — Telehealth: Payer: Self-pay | Admitting: Psychiatry

## 2024-03-31 NOTE — Telephone Encounter (Signed)
 Pt need rf of Klonopin     [facility] [address] [place]

## 2024-03-31 NOTE — Telephone Encounter (Addendum)
 LF 12/10, due 1/7  clonazepam  0.5 mg BID prn #40/month

## 2024-04-07 ENCOUNTER — Telehealth: Payer: Self-pay | Admitting: Psychiatry

## 2024-04-07 NOTE — Telephone Encounter (Signed)
 Pt states she needs a rf on Adderall and she wants to get back on Xanax . She does not want to stay on what she was put on last month.

## 2024-04-07 NOTE — Telephone Encounter (Signed)
 Pt has a RF for Adderall for fill tomorrow. Will call and discuss clonazepam ?

## 2024-04-07 NOTE — Telephone Encounter (Signed)
 Pt LVM @ 1:14p stating the same message as previous call.  She confirmed pharmacy she would like it sent to is  [id number] - [place],  - [address], [place] [address] Phone: [phone]  Fax: [phone]    Next appt 5/13

## 2024-04-07 NOTE — Telephone Encounter (Signed)
 LF clonazepam  12/10.

## 2024-04-09 ENCOUNTER — Other Ambulatory Visit: Payer: Self-pay

## 2024-04-09 DIAGNOSIS — F411 Generalized anxiety disorder: Secondary | ICD-10-CM

## 2024-04-09 MED ORDER — ALPRAZOLAM 0.5 MG PO TABS: 0.5000 mg | ORAL_TABLET | Freq: Two times a day (BID) | ORAL | 4 refills | Status: DC | PRN

## 2024-04-09 NOTE — Telephone Encounter (Signed)
"  Re pended  "

## 2024-04-09 NOTE — Telephone Encounter (Signed)
 Pt reporting that neither clonazepam  or clonidine  are working. She wants to go back to alprazolam . Reviewed the reasoning for switching to clonazepam  and she said she wants to go back to alprazolam . LF alprazolam  11/28, LF clonazepam  12/10. Will pend Rx separately for alprazolam  for your review.

## 2024-04-10 ENCOUNTER — Other Ambulatory Visit: Payer: Self-pay | Admitting: Psychiatry

## 2024-04-10 ENCOUNTER — Other Ambulatory Visit: Payer: Self-pay

## 2024-04-10 DIAGNOSIS — F411 Generalized anxiety disorder: Secondary | ICD-10-CM

## 2024-04-11 ENCOUNTER — Other Ambulatory Visit: Payer: Self-pay | Admitting: Psychiatry

## 2024-04-11 DIAGNOSIS — F411 Generalized anxiety disorder: Secondary | ICD-10-CM

## 2024-04-11 NOTE — Telephone Encounter (Signed)
Changed back to alprazolam

## 2024-05-06 ENCOUNTER — Other Ambulatory Visit: Payer: Self-pay | Admitting: Psychiatry

## 2024-05-06 DIAGNOSIS — F411 Generalized anxiety disorder: Secondary | ICD-10-CM

## 2024-05-06 NOTE — Telephone Encounter (Signed)
 Pt LVM @ 10:26a about Adderall and Xanax  not being available at [facility] in [place].  I tried to call her back to tell her the refill for Adderall was sent today and the Xanax  should have refills already at the pharmacy.  Her mailbox is not set up to receive messages.

## 2024-06-05 ENCOUNTER — Ambulatory Visit: Payer: Self-pay

## 2024-08-13 ENCOUNTER — Ambulatory Visit: Admitting: Psychiatry
